# Patient Record
Sex: Female | Born: 1965 | Race: Black or African American | Hispanic: No | Marital: Married | Smoking: Never smoker
Health system: Southern US, Community
[De-identification: ages and names within clinical notes are randomized; demographics above are authoritative.]

## PROBLEM LIST (undated history)

## (undated) DIAGNOSIS — F419 Anxiety disorder, unspecified: Secondary | ICD-10-CM

## (undated) DIAGNOSIS — G4730 Sleep apnea, unspecified: Secondary | ICD-10-CM

## (undated) DIAGNOSIS — I10 Essential (primary) hypertension: Secondary | ICD-10-CM

## (undated) DIAGNOSIS — K219 Gastro-esophageal reflux disease without esophagitis: Secondary | ICD-10-CM

## (undated) HISTORY — DX: Essential (primary) hypertension: I10

## (undated) HISTORY — DX: Gastro-esophageal reflux disease without esophagitis: K21.9

---

## 2004-10-20 ENCOUNTER — Ambulatory Visit (HOSPITAL_COMMUNITY): Admission: RE | Admit: 2004-10-20 | Discharge: 2004-10-20 | Payer: Self-pay | Admitting: Gynecology

## 2009-08-08 ENCOUNTER — Encounter: Payer: Self-pay | Admitting: Obstetrics and Gynecology

## 2009-09-27 ENCOUNTER — Encounter: Payer: Self-pay | Admitting: Pediatric Cardiology

## 2009-10-11 ENCOUNTER — Encounter: Payer: Self-pay | Admitting: Pediatric Cardiology

## 2012-10-27 ENCOUNTER — Other Ambulatory Visit (HOSPITAL_COMMUNITY)
Admission: RE | Admit: 2012-10-27 | Discharge: 2012-10-27 | Disposition: A | Discharge status: Home / Self Care | Payer: 59 | Source: Ambulatory Visit | Attending: Obstetrics and Gynecology | Admitting: Obstetrics and Gynecology

## 2012-10-27 ENCOUNTER — Other Ambulatory Visit: Payer: Self-pay | Admitting: Obstetrics and Gynecology

## 2012-10-27 DIAGNOSIS — Z01419 Encounter for gynecological examination (general) (routine) without abnormal findings: Secondary | ICD-10-CM | POA: Insufficient documentation

## 2012-10-27 DIAGNOSIS — Z1151 Encounter for screening for human papillomavirus (HPV): Secondary | ICD-10-CM | POA: Insufficient documentation

## 2012-12-03 ENCOUNTER — Ambulatory Visit (INDEPENDENT_AMBULATORY_CARE_PROVIDER_SITE_OTHER): Payer: 59 | Admitting: Surgery

## 2012-12-03 ENCOUNTER — Encounter (INDEPENDENT_AMBULATORY_CARE_PROVIDER_SITE_OTHER): Payer: Self-pay | Admitting: Surgery

## 2012-12-03 VITALS — BP 146/88 | HR 82 | Temp 97.6°F | Resp 16 | Ht 59.0 in | Wt 192.0 lb

## 2012-12-03 DIAGNOSIS — E669 Obesity, unspecified: Secondary | ICD-10-CM | POA: Insufficient documentation

## 2012-12-03 DIAGNOSIS — K436 Other and unspecified ventral hernia with obstruction, without gangrene: Secondary | ICD-10-CM | POA: Insufficient documentation

## 2012-12-03 DIAGNOSIS — K429 Umbilical hernia without obstruction or gangrene: Secondary | ICD-10-CM | POA: Insufficient documentation

## 2012-12-03 NOTE — Patient Instructions (Signed)
See the Handout(s) we gave you.  Consider surgery.  Please call our office at [phone] if you wish to schedule surgery or if you have further questions / concerns.   Hernia A hernia occurs when an internal organ pushes out through a weak spot in the abdominal wall. Hernias most commonly occur in the groin and around the navel. Hernias often can be pushed back into place (reduced). Most hernias tend to get worse over time. Some abdominal hernias can get stuck in the opening (irreducible or incarcerated hernia) and cannot be reduced. An irreducible abdominal hernia which is tightly squeezed into the opening is at risk for impaired blood supply (strangulated hernia). A strangulated hernia is a medical emergency. Because of the risk for an irreducible or strangulated hernia, surgery may be recommended to repair a hernia. CAUSES   Heavy lifting.  Prolonged coughing.  Straining to have a bowel movement.  A cut (incision) made during an abdominal surgery. HOME CARE INSTRUCTIONS   Bed rest is not required. You may continue your normal activities.  Avoid lifting more than 10 pounds (4.5 kg) or straining.  Cough gently. If you are a smoker it is best to stop. Even the best hernia repair can break down with the continual strain of coughing. Even if you do not have your hernia repaired, a cough will continue to aggravate the problem.  Do not wear anything tight over your hernia. Do not try to keep it in with an outside bandage or truss. These can damage abdominal contents if they are trapped within the hernia sac.  Eat a normal diet.  Avoid constipation. Straining over long periods of time will increase hernia size and encourage breakdown of repairs. If you cannot do this with diet alone, stool softeners may be used. SEEK IMMEDIATE MEDICAL CARE IF:   You have a fever.  You develop increasing abdominal pain.  You feel nauseous or vomit.  Your hernia is stuck outside the abdomen, looks  discolored, feels hard, or is tender.  You have any changes in your bowel habits or in the hernia that are unusual for you.  You have increased pain or swelling around the hernia.  You cannot push the hernia back in place by applying gentle pressure while lying down. MAKE SURE YOU:   Understand these instructions.  Will watch your condition.  Will get help right away if you are not doing well or get worse. Document Released: 07/16/2005 Document Revised: 10/08/2011 Document Reviewed: 03/04/2008 [employer] Patient Information 2013 [employer].  HERNIA REPAIR: POST OP INSTRUCTIONS  1. DIET: Follow a light bland diet the first 24 hours after arrival home, such as soup, liquids, crackers, etc.  Be sure to include lots of fluids daily.  Avoid fast food or heavy meals as your are more likely to get nauseated.  Eat a low fat the next few days after surgery. 2. Take your usually prescribed home medications unless otherwise directed. 3. PAIN CONTROL: a. Pain is best controlled by a usual combination of three different methods TOGETHER: i. Ice/Heat ii. Over the counter pain medication iii. Prescription pain medication b. Most patients will experience some swelling and bruising around the hernia(s) such as the bellybutton, groins, or old incisions.  Ice packs or heating pads (30-60 minutes up to 6 times a day) will help. Use ice for the first few days to help decrease swelling and bruising, then switch to heat to help relax tight/sore spots and speed recovery.  Some people prefer to   use ice alone, heat alone, alternating between ice & heat.  Experiment to what works for you.  Swelling and bruising can take several weeks to resolve.   c. It is helpful to take an over-the-counter pain medication regularly for the first few weeks.  Choose one of the following that works best for you: i. Naproxen (Aleve, etc)  Two 220mg tabs twice a day ii. Ibuprofen (Advil, etc) Three 200mg tabs four times a day  (every meal & bedtime) iii. Acetaminophen (Tylenol, etc) 325-650mg four times a day (every meal & bedtime) d. A  prescription for pain medication should be given to you upon discharge.  Take your pain medication as prescribed.  i. If you are having problems/concerns with the prescription medicine (does not control pain, nausea, vomiting, rash, itching, etc), please call us [phone] to see if we need to switch you to a different pain medicine that will work better for you and/or control your side effect better. ii. If you need a refill on your pain medication, please contact your pharmacy.  They will contact our office to request authorization. Prescriptions will not be filled after 5 pm or on week-ends. 4. Avoid getting constipated.  Between the surgery and the pain medications, it is common to experience some constipation.  Increasing fluid intake and taking a fiber supplement (such as Metamucil, Citrucel, FiberCon, MiraLax, etc) 1-2 times a day regularly will usually help prevent this problem from occurring.  A mild laxative (prune juice, Milk of Magnesia, MiraLax, etc) should be taken according to package directions if there are no bowel movements after 48 hours.   5. Wash / shower every day.  You may shower over the dressings as they are waterproof.   6. Remove your waterproof bandages 5 days after surgery.  You may leave the incision open to air.  You may replace a dressing/Band-Aid to cover the incision for comfort if you wish.  Continue to shower over incision(s) after the dressing is off.    7. ACTIVITIES as tolerated:   a. You may resume regular (light) daily activities beginning the next day-such as daily self-care, walking, climbing stairs-gradually increasing activities as tolerated.  If you can walk 30 minutes without difficulty, it is safe to try more intense activity such as jogging, treadmill, bicycling, low-impact aerobics, swimming, etc. b. Save the most intensive and strenuous  activity for last such as sit-ups, heavy lifting, contact sports, etc  Refrain from any heavy lifting or straining until you are off narcotics for pain control.   c. DO NOT PUSH THROUGH PAIN.  Let pain be your guide: If it hurts to do something, don't do it.  Pain is your body warning you to avoid that activity for another week until the pain goes down. d. You may drive when you are no longer taking prescription pain medication, you can comfortably wear a seatbelt, and you can safely maneuver your car and apply brakes. e. You may have sexual intercourse when it is comfortable.  8. FOLLOW UP in our office a. Please call [facility] at [phone] to set up an appointment to see your surgeon in the office for a follow-up appointment approximately 2-3 weeks after your surgery. b. Make sure that you call for this appointment the day you arrive home to insure a convenient appointment time. 9.  IF YOU HAVE DISABILITY OR FAMILY LEAVE FORMS, BRING THEM TO THE OFFICE FOR PROCESSING.  DO NOT GIVE THEM TO YOUR DOCTOR.  WHEN TO CALL US (  [phone]: 1. Poor pain control 2. Reactions / problems with new medications (rash/itching, nausea, etc)  3. Fever over 101.5 F (38.5 C) 4. Inability to urinate 5. Nausea and/or vomiting 6. Worsening swelling or bruising 7. Continued bleeding from incision. 8. Increased pain, redness, or drainage from the incision   The clinic staff is available to answer your questions during regular business hours (8:30am-5pm).  Please don't hesitate to call and ask to speak to one of our nurses for clinical concerns.   If you have a medical emergency, go to the nearest emergency room or call 911.  A surgeon from [facility] is always on call at the hospitals in [place]  [facility] [address], [address], [place], [place]  [address] ?  [address] [address], [place], [place]   [address] MAIN: [phone] ? TOLL FREE: [phone] ? FAX: [phone] [email or web address]  

## 2012-12-03 NOTE — Progress Notes (Signed)
Subjective:     Patient ID: Lauren Avila, female   DOB: 1966/01/29, 47 y.o.   MRN: [id number]  HPI  Lauren Avila  [birth year] [id number]  Patient Care Team: Milus Height, PA-C as PCP - General (Nurse Practitioner) Geryl Rankins, MD as Consulting Physician (Obstetrics and Gynecology)  This patient is a 47 y.o.female who presents today for surgical evaluation at the request of Ms Redmon.   Reason for visit: Periumbilical pain and mass.  ?hernia  Pleasant obese female.  She comes today with her husband.  She noted a lump around her bellybutton when she was pregnant in 2011.  It never really went away.  Her gynecologist did an ultrasound since the patient was concerned about fibroids.  Patient had few mild fibroids.  Umbilical hernia suspected.  Felt like it has gotten larger and more symptomatic.  She notes a pulling type of pain.  She feels like it swirls.  Worse when she is stretching or reaching.  Because it became more sensitive, she saw her primary care team.  [name] was concerned about a worsening hernia and sent the patient to me for evaluation.  Patient normally has a bowel movement every day.  No nausea or vomiting.  No emergency room visits.  Can walk at least 20 minutes without difficulty.  No exertional pain and chest/arm/jaw.  No prior cardiac history.  Never smoked.  Had C-sections x2.  No complications related to that.  Patient Active Problem List   Diagnosis Date Noted  . Obesity (BMI 30-39.9) 12/03/2012  . Incarcerated epigastric hernia 12/03/2012  . Umbilical hernia 12/03/2012    Past Medical History  Diagnosis Date  . GERD (gastroesophageal reflux disease)   . Hypertension     Past Surgical History  Procedure Laterality Date  . Cesarean section  1991, 2011    2x    History   Social History  . Marital Status: Married    Spouse Name: N/A    Number of Children: N/A  . Years of Education: N/A   Occupational History  . Not on file.   Social History  Main Topics  . Smoking status: Never Smoker   . Smokeless tobacco: Not on file  . Alcohol Use: Yes     Comment: very rare  . Drug Use: No  . Sexually Active: Not on file   Other Topics Concern  . Not on file   Social History Narrative  . No narrative on file    Family History  Problem Relation Age of Onset  . Diabetes Mother   . Hypertension Mother     Current Outpatient Prescriptions  Medication Sig Dispense Refill  . labetalol (NORMODYNE) 100 MG tablet Take 100 mg by mouth 2 (two) times daily.      Marland Kitchen nystatin-triamcinolone ointment (MYCOLOG) Apply topically 2 (two) times daily.      . ranitidine (ZANTAC) 150 MG tablet Take 150 mg by mouth 2 (two) times daily.       No current facility-administered medications for this visit.     No Known Allergies  BP 146/88  Pulse 82  Temp(Src) 97.6 F (36.4 C) (Temporal)  Resp 16  Ht 4\' 11"  (1.499 m)  Wt 192 lb (87.091 kg)  BMI 38.76 kg/m2  SpO2 97%  No results found.   Review of Systems  Constitutional: Negative for fever, chills, diaphoresis, appetite change and fatigue.  HENT: Negative for ear pain, sore throat, trouble swallowing, neck pain and ear discharge.   Eyes: Negative for  photophobia, discharge and visual disturbance.  Respiratory: Negative for cough, choking, chest tightness and shortness of breath.   Cardiovascular: Negative for chest pain and palpitations.  Gastrointestinal: Positive for abdominal pain. Negative for nausea, vomiting, diarrhea, constipation, anal bleeding and rectal pain.  Genitourinary: Negative for dysuria, frequency and difficulty urinating.  Musculoskeletal: Negative for myalgias and gait problem.  Skin: Negative for color change, pallor and rash.  Neurological: Negative for dizziness, speech difficulty, weakness and numbness.  Hematological: Negative for adenopathy.  Psychiatric/Behavioral: Negative for confusion and agitation. The patient is not nervous/anxious.        Objective:    Physical Exam  Constitutional: She is oriented to person, place, and time. She appears well-developed and well-nourished. No distress.  HENT:  Head: Normocephalic.  Mouth/Throat: Oropharynx is clear and moist. No oropharyngeal exudate.  Eyes: Conjunctivae and EOM are normal. Pupils are equal, round, and reactive to light. No scleral icterus.  Neck: Normal range of motion. Neck supple. No tracheal deviation present.  Cardiovascular: Normal rate, regular rhythm and intact distal pulses.   Pulmonary/Chest: Effort normal and breath sounds normal. No respiratory distress. She exhibits no tenderness.  Abdominal: Soft. She exhibits no distension and no mass. There is no tenderness. A hernia is present. Hernia confirmed negative in the right inguinal area and confirmed negative in the left inguinal area.    Genitourinary: No vaginal discharge found.  Musculoskeletal: Normal range of motion. She exhibits no tenderness.  Lymphadenopathy:    She has no cervical adenopathy.       Right: No inguinal adenopathy present.       Left: No inguinal adenopathy present.  Neurological: She is alert and oriented to person, place, and time. No cranial nerve deficit. She exhibits normal muscle tone. Coordination normal.  Skin: Skin is warm and dry. No rash noted. She is not diaphoretic. No erythema.  Psychiatric: She has a normal mood and affect. Her behavior is normal. Judgment and thought content normal.       Assessment:     Incarcerated epigastric and small reducible umbilical ventral hernias     Plan:     I think she would benefit from laparoscopic exploration and repair.  Given her obesity, underlay repair with mesh a good idea.  Hopefully just a 15 cm sheet.  Hopefully go home that day as an outpatient.  The anatomy & physiology of the abdominal wall was discussed.  The pathophysiology of hernias was discussed.  Natural history risks without surgery including progeressive enlargement, pain,  incarceration & strangulation was discussed.   Contributors to complications such as smoking, obesity, diabetes, prior surgery, etc were discussed.   I feel the risks of no intervention will lead to serious problems that outweigh the operative risks; therefore, I recommended surgery to reduce and repair the hernia.  I explained laparoscopic techniques with possible need for an open approach.  I noted the probable use of mesh to patch and/or buttress the hernia repair  Risks such as bleeding, infection, abscess, need for further treatment, heart attack, death, and other risks were discussed.  I noted a good likelihood this will help address the problem.   Goals of post-operative recovery were discussed as well.  Possibility that this will not correct all symptoms was explained.  I stressed the importance of low-impact activity, aggressive pain control, avoiding constipation, & not pushing through pain to minimize risk of post-operative chronic pain or injury. Possibility of reherniation especially with smoking, obesity, diabetes, immunosuppression, and other health conditions  was discussed.  We will work to minimize complications.     An educational handout further explaining the pathology & treatment options was given as well.  Questions were answered.  The patient expresses understanding & wishes to proceed with surgery.

## 2012-12-11 ENCOUNTER — Encounter (INDEPENDENT_AMBULATORY_CARE_PROVIDER_SITE_OTHER): Payer: Self-pay

## 2013-02-16 ENCOUNTER — Encounter (HOSPITAL_COMMUNITY): Payer: Self-pay | Admitting: Pharmacy Technician

## 2013-02-16 NOTE — Patient Instructions (Signed)
Jaylee Freeze  02/16/2013   Your procedure is scheduled on:  02/24/13    Report to [facility] at   0530      AM.  Call this number if you have problems the morning of surgery: [phone]   Remember:   Do not eat food or drink liquids after midnight.   Take these medicines the morning of surgery with A SIP OF WATER:    Do not wear jewelry, make-up or nail polish.  Do not wear lotions, powders, or perfumes.   Do not shave 48 hours prior to surgery.   Do not bring valuables to the hospital.  Contacts, dentures or bridgework may not be worn into surgery.     Patients discharged the day of surgery will not be allowed to drive  home.  Name and phone number of your driver:    SEE [facility] INSTRUCTION SHEET    Please read over the following fact sheets that you were given: coughing and deep breathing exercises, leg exercises               Failure to comply with these instructions may result in cancellation of your surgery.                Patient Signature ____________________________              Nurse Signature _____________________________

## 2013-02-17 ENCOUNTER — Encounter (HOSPITAL_COMMUNITY): Payer: Self-pay

## 2013-02-17 ENCOUNTER — Ambulatory Visit (HOSPITAL_COMMUNITY)
Admission: RE | Admit: 2013-02-17 | Discharge: 2013-02-17 | Disposition: A | Discharge status: Home / Self Care | Payer: 59 | Source: Ambulatory Visit | Attending: Surgery | Admitting: Surgery

## 2013-02-17 ENCOUNTER — Encounter (HOSPITAL_COMMUNITY)
Admission: RE | Admit: 2013-02-17 | Discharge: 2013-02-17 | Disposition: A | Discharge status: Home / Self Care | Payer: 59 | Source: Ambulatory Visit | Attending: Surgery | Admitting: Surgery

## 2013-02-17 DIAGNOSIS — K429 Umbilical hernia without obstruction or gangrene: Secondary | ICD-10-CM | POA: Insufficient documentation

## 2013-02-17 DIAGNOSIS — Z01812 Encounter for preprocedural laboratory examination: Secondary | ICD-10-CM | POA: Insufficient documentation

## 2013-02-17 DIAGNOSIS — K439 Ventral hernia without obstruction or gangrene: Secondary | ICD-10-CM | POA: Insufficient documentation

## 2013-02-17 DIAGNOSIS — Z01810 Encounter for preprocedural cardiovascular examination: Secondary | ICD-10-CM | POA: Insufficient documentation

## 2013-02-17 DIAGNOSIS — I10 Essential (primary) hypertension: Secondary | ICD-10-CM | POA: Insufficient documentation

## 2013-02-17 HISTORY — DX: Anxiety disorder, unspecified: F41.9

## 2013-02-17 HISTORY — DX: Sleep apnea, unspecified: G47.30

## 2013-02-17 LAB — BASIC METABOLIC PANEL
Chloride: 102 mEq/L (ref 96–112)
Creatinine, Ser: 0.76 mg/dL (ref 0.50–1.10)
GFR calc Af Amer: >90 mL/min (ref >90)
Sodium: 138 mEq/L (ref 135–145)

## 2013-02-17 LAB — HCG, SERUM, QUALITATIVE: Preg, Serum: NEGATIVE

## 2013-02-17 LAB — CBC
MCV: 82.4 fL (ref 78.0–100.0)
Platelets: 372 10*3/uL (ref 150–400)
RBC: 4.94 MIL/uL (ref 3.87–5.11)
RDW: 13.3 % (ref 11.5–15.5)
WBC: 4.9 10*3/uL (ref 4.0–10.5)

## 2013-02-17 NOTE — Progress Notes (Signed)
Last office visit note with PCP dated 11/24/12 on chart

## 2013-02-17 NOTE — Progress Notes (Signed)
Sleep Study Reports requested and placed on chart.   Sleep Study Report from 01/26/2008 on chart with CPAP settings highlighted.   Sleep Study Report fron 02/07/2007 on chart

## 2013-02-23 NOTE — Anesthesia Preprocedure Evaluation (Addendum)
Anesthesia Evaluation  Patient identified by MRN, date of birth, ID band Patient awake    Reviewed: Allergy & Precautions, H&P , NPO status , Patient's Chart, lab work & pertinent test results  Airway Mallampati: II TM Distance: >3 FB Neck ROM: Full    Dental  (+) Teeth Intact and Dental Advisory Given   Pulmonary neg pulmonary ROS, sleep apnea and Continuous Positive Airway Pressure Ventilation ,  breath sounds clear to auscultation  Pulmonary exam normal       Cardiovascular hypertension, Pt. on medications and Pt. on home beta blockers Rhythm:Regular Rate:Normal     Neuro/Psych Anxiety negative neurological ROS     GI/Hepatic Neg liver ROS, GERD-  Medicated,  Endo/Other  negative endocrine ROS  Renal/GU negative Renal ROS  negative genitourinary   Musculoskeletal negative musculoskeletal ROS (+)   Abdominal   Peds  Hematology negative hematology ROS (+)   Anesthesia Other Findings   Reproductive/Obstetrics                          Anesthesia Physical Anesthesia Plan  ASA: II  Anesthesia Plan: General   Post-op Pain Management:    Induction: Intravenous  Airway Management Planned: Oral ETT  Additional Equipment:   Intra-op Plan:   Post-operative Plan: Extubation in OR  Informed Consent: I have reviewed the patients History and Physical, chart, labs and discussed the procedure including the risks, benefits and alternatives for the proposed anesthesia with the patient or authorized representative who has indicated his/her understanding and acceptance.   Dental advisory given  Plan Discussed with: CRNA  Anesthesia Plan Comments:         Anesthesia Quick Evaluation

## 2013-02-24 ENCOUNTER — Encounter: Payer: Self-pay | Admitting: General Surgery

## 2013-02-24 ENCOUNTER — Ambulatory Visit (HOSPITAL_COMMUNITY)
Admission: RE | Admit: 2013-02-24 | Discharge: 2013-02-24 | Disposition: A | Discharge status: Home / Self Care | Payer: 59 | Source: Ambulatory Visit | Attending: Surgery | Admitting: Surgery

## 2013-02-24 ENCOUNTER — Encounter (HOSPITAL_COMMUNITY)
Admission: RE | Disposition: A | Discharge status: Home / Self Care | Payer: Self-pay | Source: Ambulatory Visit | Attending: Surgery

## 2013-02-24 ENCOUNTER — Encounter (HOSPITAL_COMMUNITY): Payer: Self-pay | Admitting: Anesthesiology

## 2013-02-24 ENCOUNTER — Ambulatory Visit (HOSPITAL_COMMUNITY): Payer: 59 | Admitting: Anesthesiology

## 2013-02-24 ENCOUNTER — Encounter (HOSPITAL_COMMUNITY): Payer: Self-pay | Admitting: *Deleted

## 2013-02-24 DIAGNOSIS — G4730 Sleep apnea, unspecified: Secondary | ICD-10-CM | POA: Insufficient documentation

## 2013-02-24 DIAGNOSIS — K420 Umbilical hernia with obstruction, without gangrene: Secondary | ICD-10-CM

## 2013-02-24 DIAGNOSIS — K219 Gastro-esophageal reflux disease without esophagitis: Secondary | ICD-10-CM | POA: Insufficient documentation

## 2013-02-24 DIAGNOSIS — K429 Umbilical hernia without obstruction or gangrene: Secondary | ICD-10-CM

## 2013-02-24 DIAGNOSIS — M6200 Separation of muscle (nontraumatic), unspecified site: Secondary | ICD-10-CM | POA: Insufficient documentation

## 2013-02-24 DIAGNOSIS — K436 Other and unspecified ventral hernia with obstruction, without gangrene: Secondary | ICD-10-CM

## 2013-02-24 DIAGNOSIS — E669 Obesity, unspecified: Secondary | ICD-10-CM | POA: Insufficient documentation

## 2013-02-24 DIAGNOSIS — I10 Essential (primary) hypertension: Secondary | ICD-10-CM | POA: Insufficient documentation

## 2013-02-24 DIAGNOSIS — D259 Leiomyoma of uterus, unspecified: Secondary | ICD-10-CM | POA: Insufficient documentation

## 2013-02-24 HISTORY — PX: UMBILICAL HERNIA REPAIR: SHX196

## 2013-02-24 HISTORY — PX: INSERTION OF MESH: SHX5868

## 2013-02-24 SURGERY — REPAIR, HERNIA, UMBILICAL, LAPAROSCOPIC
Anesthesia: General | Site: Abdomen | Wound class: Clean

## 2013-02-24 MED ORDER — SUFENTANIL CITRATE 50 MCG/ML IV SOLN
INTRAVENOUS | Status: DC | PRN
  Administered 2013-02-24: 5 ug via INTRAVENOUS
  Administered 2013-02-24: 10 ug via INTRAVENOUS
  Administered 2013-02-24: 5 ug via INTRAVENOUS
  Administered 2013-02-24 (×2): 10 ug via INTRAVENOUS
  Administered 2013-02-24 (×2): 5 ug via INTRAVENOUS

## 2013-02-24 MED ORDER — SODIUM CHLORIDE 0.9 % IJ SOLN: 3.0000 mL | INTRAMUSCULAR | Status: DC | PRN

## 2013-02-24 MED ORDER — BUPIVACAINE 0.25 % ON-Q PUMP DUAL CATH 300 ML
300.0000 mL | INJECTION | Status: DC
  Filled 2013-02-24: qty 300

## 2013-02-24 MED ORDER — LIDOCAINE HCL (CARDIAC) 20 MG/ML IV SOLN
INTRAVENOUS | Status: DC | PRN
  Administered 2013-02-24: 70 mg via INTRAVENOUS

## 2013-02-24 MED ORDER — ACETAMINOPHEN 650 MG RE SUPP
650.0000 mg | RECTAL | Status: DC | PRN
  Filled 2013-02-24: qty 1

## 2013-02-24 MED ORDER — CEFAZOLIN SODIUM-DEXTROSE 2-3 GM-% IV SOLR
INTRAVENOUS | Status: AC
  Filled 2013-02-24: qty 50

## 2013-02-24 MED ORDER — PROPOFOL 10 MG/ML IV BOLUS
INTRAVENOUS | Status: DC | PRN
  Administered 2013-02-24: 50 mg via INTRAVENOUS
  Administered 2013-02-24: 150 mg via INTRAVENOUS

## 2013-02-24 MED ORDER — HYDRALAZINE HCL 20 MG/ML IJ SOLN
5.0000 mg | Freq: Once | INTRAMUSCULAR | Status: AC
  Administered 2013-02-24: 5 mg via INTRAVENOUS

## 2013-02-24 MED ORDER — HYDROMORPHONE HCL PF 1 MG/ML IJ SOLN
INTRAMUSCULAR | Status: AC
  Filled 2013-02-24: qty 1

## 2013-02-24 MED ORDER — BUPIVACAINE-EPINEPHRINE PF 0.25-1:200000 % IJ SOLN
INTRAMUSCULAR | Status: AC
  Filled 2013-02-24: qty 30

## 2013-02-24 MED ORDER — BUPIVACAINE-EPINEPHRINE PF 0.25-1:200000 % IJ SOLN
INTRAMUSCULAR | Status: DC | PRN
  Administered 2013-02-24: 80 mL

## 2013-02-24 MED ORDER — STERILE WATER FOR IRRIGATION IR SOLN
Status: DC | PRN
  Administered 2013-02-24: 1500 mL

## 2013-02-24 MED ORDER — ONDANSETRON HCL 4 MG/2ML IJ SOLN
INTRAMUSCULAR | Status: DC | PRN
  Administered 2013-02-24: 4 mg via INTRAVENOUS

## 2013-02-24 MED ORDER — CEFAZOLIN SODIUM-DEXTROSE 2-3 GM-% IV SOLR
2.0000 g | INTRAVENOUS | Status: AC
  Administered 2013-02-24: 2 g via INTRAVENOUS

## 2013-02-24 MED ORDER — BUPIVACAINE 0.25 % ON-Q PUMP DUAL CATH 300 ML
INJECTION | Status: DC | PRN
  Administered 2013-02-24: 300 mL

## 2013-02-24 MED ORDER — ACETAMINOPHEN 10 MG/ML IV SOLN
INTRAVENOUS | Status: DC | PRN
  Administered 2013-02-24: 1000 mg via INTRAVENOUS

## 2013-02-24 MED ORDER — ACETAMINOPHEN 10 MG/ML IV SOLN
1000.0000 mg | Freq: Once | INTRAVENOUS | Status: DC
  Filled 2013-02-24: qty 100

## 2013-02-24 MED ORDER — LIDOCAINE HCL 4 % MT SOLN
OROMUCOSAL | Status: DC | PRN
  Administered 2013-02-24: 4 mL via TOPICAL

## 2013-02-24 MED ORDER — HYDROMORPHONE HCL PF 1 MG/ML IJ SOLN
0.2500 mg | INTRAMUSCULAR | Status: DC | PRN
  Administered 2013-02-24 (×2): 0.5 mg via INTRAVENOUS

## 2013-02-24 MED ORDER — SODIUM CHLORIDE 0.9 % IJ SOLN: 3.0000 mL | Freq: Two times a day (BID) | INTRAMUSCULAR | Status: DC

## 2013-02-24 MED ORDER — FENTANYL CITRATE 0.05 MG/ML IJ SOLN
25.0000 ug | INTRAMUSCULAR | Status: DC | PRN
Start: 2013-02-24 — End: 2013-02-24

## 2013-02-24 MED ORDER — OXYCODONE HCL 5 MG PO TABS
5.0000 mg | ORAL_TABLET | ORAL | Status: DC | PRN
  Administered 2013-02-24: 5 mg via ORAL
  Filled 2013-02-24: qty 1

## 2013-02-24 MED ORDER — CHLORHEXIDINE GLUCONATE 4 % EX LIQD
1.0000 "application " | Freq: Once | CUTANEOUS | Status: DC
  Filled 2013-02-24: qty 15

## 2013-02-24 MED ORDER — DEXAMETHASONE SODIUM PHOSPHATE 10 MG/ML IJ SOLN
INTRAMUSCULAR | Status: DC | PRN
  Administered 2013-02-24: 10 mg via INTRAVENOUS

## 2013-02-24 MED ORDER — ONDANSETRON HCL 4 MG/2ML IJ SOLN
4.0000 mg | Freq: Four times a day (QID) | INTRAMUSCULAR | Status: DC | PRN
  Filled 2013-02-24: qty 2

## 2013-02-24 MED ORDER — HYDRALAZINE HCL 20 MG/ML IJ SOLN
INTRAMUSCULAR | Status: AC
  Filled 2013-02-24: qty 1

## 2013-02-24 MED ORDER — ROCURONIUM BROMIDE 100 MG/10ML IV SOLN
INTRAVENOUS | Status: DC | PRN
  Administered 2013-02-24: 5 mg via INTRAVENOUS
  Administered 2013-02-24: 10 mg via INTRAVENOUS
  Administered 2013-02-24: 35 mg via INTRAVENOUS
  Administered 2013-02-24: 10 mg via INTRAVENOUS

## 2013-02-24 MED ORDER — GLYCOPYRROLATE 0.2 MG/ML IJ SOLN
INTRAMUSCULAR | Status: DC | PRN
  Administered 2013-02-24: 0.6 mg via INTRAVENOUS

## 2013-02-24 MED ORDER — BUPIVACAINE-EPINEPHRINE 0.25% -1:200000 IJ SOLN
INTRAMUSCULAR | Status: AC
  Filled 2013-02-24: qty 1

## 2013-02-24 MED ORDER — LACTATED RINGERS IV SOLN
INTRAVENOUS | Status: DC
  Administered 2013-02-24: 12:00:00 via INTRAVENOUS

## 2013-02-24 MED ORDER — LACTATED RINGERS IV SOLN
INTRAVENOUS | Status: DC | PRN
  Administered 2013-02-24 (×2): via INTRAVENOUS

## 2013-02-24 MED ORDER — OXYCODONE HCL 5 MG PO TABS: 5.0000 mg | ORAL_TABLET | ORAL | Status: DC | PRN

## 2013-02-24 MED ORDER — LACTATED RINGERS IR SOLN
Status: DC | PRN
  Administered 2013-02-24: 1000 mL

## 2013-02-24 MED ORDER — HYDRALAZINE HCL 20 MG/ML IJ SOLN
INTRAMUSCULAR | Status: DC | PRN
  Administered 2013-02-24: 5 mg via INTRAVENOUS
  Administered 2013-02-24: 10 mg via INTRAVENOUS
  Administered 2013-02-24: 5 mg via INTRAVENOUS

## 2013-02-24 MED ORDER — NAPROXEN 500 MG PO TABS: 500.0000 mg | ORAL_TABLET | Freq: Two times a day (BID) | ORAL | Status: DC

## 2013-02-24 MED ORDER — LABETALOL HCL 5 MG/ML IV SOLN
INTRAVENOUS | Status: DC | PRN
  Administered 2013-02-24 (×2): 5 mg via INTRAVENOUS

## 2013-02-24 MED ORDER — PROMETHAZINE HCL 25 MG/ML IJ SOLN: 6.2500 mg | INTRAMUSCULAR | Status: DC | PRN

## 2013-02-24 MED ORDER — SODIUM CHLORIDE 0.9 % IV SOLN: 250.0000 mL | INTRAVENOUS | Status: DC | PRN

## 2013-02-24 MED ORDER — SUCCINYLCHOLINE CHLORIDE 20 MG/ML IJ SOLN
INTRAMUSCULAR | Status: DC | PRN
  Administered 2013-02-24: 100 mg via INTRAVENOUS

## 2013-02-24 MED ORDER — MIDAZOLAM HCL 5 MG/5ML IJ SOLN
INTRAMUSCULAR | Status: DC | PRN
  Administered 2013-02-24: 2 mg via INTRAVENOUS

## 2013-02-24 MED ORDER — NEOSTIGMINE METHYLSULFATE 1 MG/ML IJ SOLN
INTRAMUSCULAR | Status: DC | PRN
  Administered 2013-02-24: 4 mg via INTRAVENOUS

## 2013-02-24 MED ORDER — KETOROLAC TROMETHAMINE 30 MG/ML IJ SOLN
INTRAMUSCULAR | Status: DC | PRN
  Administered 2013-02-24: 30 mg via INTRAVENOUS

## 2013-02-24 MED ORDER — ACETAMINOPHEN 325 MG PO TABS: 650.0000 mg | ORAL_TABLET | ORAL | Status: DC | PRN

## 2013-02-24 SURGICAL SUPPLY — 61 items
APL SKNCLS STERI-STRIP NONHPOA (GAUZE/BANDAGES/DRESSINGS)
APPLIER CLIP 5 13 M/L LIGAMAX5 (MISCELLANEOUS)
APR CLP MED LRG 5 ANG JAW (MISCELLANEOUS)
BAG URINE DRAINAGE (UROLOGICAL SUPPLIES) IMPLANT
BENZOIN TINCTURE PRP APPL 2/3 (GAUZE/BANDAGES/DRESSINGS) IMPLANT
BINDER ABD UNIV 12 45-62 (WOUND CARE) ×1 IMPLANT
BINDER ABDOMINAL 46IN 62IN (WOUND CARE) ×2
CANISTER SUCTION 2500CC (MISCELLANEOUS) ×2 IMPLANT
CATH FOLEY 3WAY  5CC 16FR (CATHETERS)
CATH FOLEY 3WAY 5CC 16FR (CATHETERS) IMPLANT
CATH KIT ON-Q SILVERSOAK 7.5IN (CATHETERS) ×4 IMPLANT
CLIP APPLIE 5 13 M/L LIGAMAX5 (MISCELLANEOUS) IMPLANT
CLOTH BEACON ORANGE TIMEOUT ST (SAFETY) ×2 IMPLANT
DECANTER SPIKE VIAL GLASS SM (MISCELLANEOUS) ×2 IMPLANT
DEVICE SECURE STRAP 25 ABSORB (INSTRUMENTS) ×2 IMPLANT
DEVICE TROCAR PUNCTURE CLOSURE (ENDOMECHANICALS) ×2 IMPLANT
DISSECTOR BLUNT TIP ENDO 5MM (MISCELLANEOUS) IMPLANT
DRAPE LAPAROSCOPIC ABDOMINAL (DRAPES) ×2 IMPLANT
DRSG TEGADERM 2-3/8X2-3/4 SM (GAUZE/BANDAGES/DRESSINGS) ×6 IMPLANT
DRSG TEGADERM 4X4.75 (GAUZE/BANDAGES/DRESSINGS) ×2 IMPLANT
ELECT REM PT RETURN 9FT ADLT (ELECTROSURGICAL) ×2
ELECTRODE REM PT RTRN 9FT ADLT (ELECTROSURGICAL) ×1 IMPLANT
FILTER SMOKE EVAC LAPAROSHD (FILTER) IMPLANT
GAUZE SPONGE 2X2 8PLY STRL LF (GAUZE/BANDAGES/DRESSINGS) ×1 IMPLANT
GLOVE BIOGEL PI IND STRL 7.0 (GLOVE) ×4 IMPLANT
GLOVE BIOGEL PI INDICATOR 7.0 (GLOVE) ×4
GLOVE ECLIPSE 8.0 STRL XLNG CF (GLOVE) ×2 IMPLANT
GLOVE INDICATOR 8.0 STRL GRN (GLOVE) ×2 IMPLANT
GOWN STRL NON-REIN LRG LVL3 (GOWN DISPOSABLE) IMPLANT
GOWN STRL REIN XL XLG (GOWN DISPOSABLE) ×8 IMPLANT
KIT BASIN OR (CUSTOM PROCEDURE TRAY) ×2 IMPLANT
MARKER SKIN DUAL TIP RULER LAB (MISCELLANEOUS) ×2 IMPLANT
MESH VENTRALIGHT ST 7X9N (Mesh General) ×2 IMPLANT
NEEDLE SPNL 22GX3.5 QUINCKE BK (NEEDLE) ×2 IMPLANT
NS IRRIG 1000ML POUR BTL (IV SOLUTION) ×2 IMPLANT
PENCIL BUTTON HOLSTER BLD 10FT (ELECTRODE) IMPLANT
PLUG CATH AND CAP STER (CATHETERS) IMPLANT
SCALPEL HARMONIC ACE (MISCELLANEOUS) IMPLANT
SCISSORS LAP 5X35 DISP (ENDOMECHANICALS) ×2 IMPLANT
SET CYSTO W/LG BORE CLAMP LF (SET/KITS/TRAYS/PACK) IMPLANT
SET IRRIG TUBING LAPAROSCOPIC (IRRIGATION / IRRIGATOR) IMPLANT
SLEEVE Z-THREAD 5X100MM (TROCAR) IMPLANT
SOLUTION ANTI FOG 6CC (MISCELLANEOUS) IMPLANT
SPONGE GAUZE 2X2 STER 10/PKG (GAUZE/BANDAGES/DRESSINGS) ×1
STAPLER VISISTAT 35W (STAPLE) ×2 IMPLANT
STRIP CLOSURE SKIN 1/2X4 (GAUZE/BANDAGES/DRESSINGS) ×6 IMPLANT
SUT ETHIBOND NAB CT1 #1 30IN (SUTURE) IMPLANT
SUT MNCRL AB 4-0 PS2 18 (SUTURE) ×2 IMPLANT
SUT NOVA NAB DX-16 0-1 5-0 T12 (SUTURE) IMPLANT
SUT PDS AB 0 CT1 36 (SUTURE) ×2 IMPLANT
SUT PROLENE 1 CT 1 30 (SUTURE) ×10 IMPLANT
SUT VIC AB 2-0 UR6 27 (SUTURE) IMPLANT
SYRINGE 10CC LL (SYRINGE) IMPLANT
TACKER 5MM HERNIA 3.5CML NAB (ENDOMECHANICALS) IMPLANT
TOWEL OR 17X26 10 PK STRL BLUE (TOWEL DISPOSABLE) ×2 IMPLANT
TRAY FOLEY CATH 14FRSI W/METER (CATHETERS) ×2 IMPLANT
TRAY LAP CHOLE (CUSTOM PROCEDURE TRAY) ×2 IMPLANT
TROCAR BLADELESS OPT 5 100 (ENDOMECHANICALS) ×2 IMPLANT
TROCAR XCEL NON-BLD 11X100MML (ENDOMECHANICALS) ×2 IMPLANT
TUBING INSUFFLATION 10FT LAP (TUBING) ×2 IMPLANT
TUNNELER SHEATH ON-Q 16GX12 DP (PAIN MANAGEMENT) ×2 IMPLANT

## 2013-02-24 NOTE — Progress Notes (Signed)
Text sent to dr gross re: pt request work excuse and also up to BR  Vomited about1050 (per tech)  Now states she feels much better and wants to go home  Taking ice  Awaiting return call from MD

## 2013-02-24 NOTE — Progress Notes (Signed)
Apresoline 5 mg IVP repeated for elevated blood pressures.

## 2013-02-24 NOTE — Op Note (Signed)
02/24/2013  9:49 AM  PATIENT:  Lauren Avila  47 y.o. female  Patient Care Team: Milus Height, PA-C as PCP - General (Nurse Practitioner) Geryl Rankins, MD as Consulting Physician (Obstetrics and Gynecology)  PRE-OPERATIVE DIAGNOSIS:  epigastric and umbilical abdominal wall hernias  POST-OPERATIVE DIAGNOSIS:  Incarcerated supraumbilical hernia and umbilical hernia  PROCEDURE:  Procedure(s): LAPAROSCOPIC REPAIR OF SUPRA UMBILICAL AND UMBILICAL  HERNIAS PLACEMENT OF OnQ PUMP INSERTION OF MESH  SURGEON:  Surgeon(s): Ardeth Sportsman, MD  ASSISTANT: RN   ANESTHESIA:   local and general  EBL:  Total I/O In: 1900 [I.V.:1900] Out: 800 [Urine:800]  Delay start of Pharmacological VTE agent (>24hrs) due to surgical blood loss or risk of bleeding:  no  DRAINS: none   SPECIMEN:  No Specimen  DISPOSITION OF SPECIMEN:  N/A  COUNTS:  YES  PLAN OF CARE: Discharge to home after PACU  PATIENT DISPOSITION:  PACU - hemodynamically stable.  INDICATION: Pleasant patient has developed a ventral wall abdominal hernia.   Recommendation was made for surgical repair:  The anatomy & physiology of the abdominal wall was discussed. The pathophysiology of hernias was discussed. Natural history risks without surgery including progeressive enlargement, pain, incarceration & strangulation was discussed. Contributors to complications such as smoking, obesity, diabetes, prior surgery, etc were discussed.  I feel the risks of no intervention will lead to serious problems that outweigh the operative risks; therefore, I recommended surgery to reduce and repair the hernia. I explained laparoscopic techniques with possible need for an open approach. I noted the probable use of mesh to patch and/or buttress the hernia repair  Risks such as bleeding, infection, abscess, need for further treatment, heart attack, death, and other risks were discussed. I noted a good likelihood this will help address the problem.  Goals of post-operative recovery were discussed as well. Possibility that this will not correct all symptoms was explained. I stressed the importance of low-impact activity, aggressive pain control, avoiding constipation, & not pushing through pain to minimize risk of post-operative chronic pain or injury. Possibility of reherniation especially with smoking, obesity, diabetes, immunosuppression, and other health conditions was discussed. We will work to minimize complications.  An educational handout further explaining the pathology & treatment options was given as well. Questions were answered. The patient expresses understanding & wishes to proceed with surgery.   OR FINDINGS: Patient had central diastases recti with umbilical hernia.  She had falciform ligament incarcerated in a supraumbilical hernia.  Region 7 x 3 cm   Type of repair - Laparoscopic underlay repair   Name of mesh - Bard Ventralex  Size of mesh - Length 22 cm, Width 17 cm  Mesh overlap - 5-7 cm  Placement of mesh -  Intraperitoneal underlay repair   DESCRIPTION:   Informed consent was confirmed. The patient underwent general anaesthesia without difficulty. The patient was positioned appropriately. VTE prevention in place. The patient's abdomen was clipped, prepped, & draped in a sterile fashion. Surgical timeout confirmed our plan.  The patient was positioned in reverse Trendelenburg. Abdominal entry was gained using optical entry technique in the left upper abdomen. Entry was clean. I induced carbon dioxide insufflation. Camera inspection revealed no injury. Extra ports were carefully placed under direct laparoscopic visualization.   I could see the hernia in the Central abdomen.  I freed off the falciform ligament off the anterior abdominal wall to expose supraumbilical hernia 2x3 cm incarcerated in the setting of diastases recti.  She had adhesions from her prior C-section  in the suprapubic region of the uterus the  peritoneum.  I left those alone.  She had a 3 x 2 cm umbilical hernia not incarcerated.I primarily used and focused cold scissors.    I made sure hemostasis was good.  I mapped out the region using a needle passer.   To ensure that I would have at least 5 cm radial coverage outside of the hernia defect & given her significant central obesity, I chose a 22x17cm cm dual sided mesh.  I placed #1 Prolene stitches around its edge about every 5 cm = 14 total.  I rolled the mesh & placed into the peritoneal cavity through the 10 cm fascial defect.  I unrolled  the mesh and positioned it appropriately.  I secured the mesh to cover up the hernia defect using a laparoscopic suture passer to pass the tails of the Prolene through the abdominal wall & tagged them with clamps.  I started out in four corners to make sure I had the mesh centered over the hernia defect appropriately, and then proceeded to work in quadrants.  We evacuated CO2 & desufflated the abdomen.  I tied the fascial stitches down.  I reinsufflated the abdomen.  The mesh provided at least 5-10 cm circumferential coverage around the entire region of hernia defects.   I tacked the edges & central part of the mesh with SecureStrap absorbable tacks.   Hemostasis was excellent.  I closed the fascia port site on the 10 mm port using a 0 Vicryl stitch using laparoscopic intracorporeal suture passer.  I then placed On-Q catheter sheaths in the preperitoneal plane under direct laparoscopic guidance from the subxiphoid region down to the posterior iliac crests in the lower flanks. I did reinspection. Hemostasis was good. Mesh laid well. Capnoperitoneum was evacuated. Ports were removed. The skin was closed with Monocryl at the port sites and Steri-Strips on the fascial stitch puncture sites. OnQ catheters were placed and the sheathes peeled away. On-Q pump was secured. Patient is being extubated to go to the recovery room. I'm about to discuss operative findings with  the patient's family.

## 2013-02-24 NOTE — Progress Notes (Signed)
Spoke to [employer]  PA for [employer] to [employer] pt  She will send work excuse to our unit now

## 2013-02-24 NOTE — Transfer of Care (Signed)
Immediate Anesthesia Transfer of Care Note  Patient: [name]  Procedure(s) Performed: Procedure(s): LAPAROSCOPIC REPAIR OF SUPRA UMBILICAL AND UMBILICAL  HERNIAS  AND PLACEMENT OF PAIN PUMP (N/A) INSERTION OF MESH (N/A)  Patient Location: PACU  Anesthesia Type:General  Level of Consciousness: awake, alert , oriented and patient cooperative  Airway & Oxygen Therapy: Patient Spontanous Breathing and Patient connected to face mask oxygen  Post-op Assessment: Report given to PACU RN, Post -op Vital signs reviewed and stable and Patient moving all extremities X 4  Post vital signs: stable  Complications: No apparent anesthesia complications

## 2013-02-24 NOTE — Anesthesia Postprocedure Evaluation (Signed)
Anesthesia Post Note  Patient: Lauren Avila  Procedure(s) Performed: Procedure(s) (LRB): LAPAROSCOPIC REPAIR OF SUPRA UMBILICAL AND UMBILICAL  HERNIAS  AND PLACEMENT OF PAIN PUMP (N/A) INSERTION OF MESH (N/A)  Anesthesia type: General  Patient location: PACU  Post pain: Pain level controlled  Post assessment: Post-op Vital signs reviewed  Last Vitals:  Filed Vitals:   02/24/13 1130  BP: 193/97  Pulse: 103  Temp:   Resp: 21    Post vital signs: Reviewed  Level of consciousness: sedated  Complications: No apparent anesthesia complications

## 2013-02-24 NOTE — Progress Notes (Signed)
Spoke with patient prior to discharge from PACU regarding need for follow-up regarding BP management. Patient understands and will seek appointment with PCP.

## 2013-02-24 NOTE — H&P (Signed)
Lauren Avila  07-28-1966 [id number]  CARE TEAM:  PCP: Lauren Avila  Outpatient Care Team: Patient Care Team: Lauren Height, PA-C as PCP - General (Nurse Practitioner) Lauren Rankins, MD as Consulting Physician (Obstetrics and Gynecology)  Inpatient Treatment Team: Treatment Team: Attending Provider: Ardeth Sportsman, MD  This patient is a 48 y.o.female who presents today for surgical evaluation for abdominal hernias  Pleasant obese female. She comes today with her husband. She noted a lump around her bellybutton when she was pregnant in 2011. It never really went away. Her gynecologist did an ultrasound since the patient was concerned about fibroids. Patient had few mild fibroids. Umbilical hernia suspected. Felt like it has gotten larger and more symptomatic. She notes a pulling type of pain. She feels like it swirls. Worse when she is stretching or reaching. Because it became more sensitive, she saw her primary care team. [name] was concerned about a worsening hernia and sent the patient to me for evaluation.  Patient normally has a bowel movement every day. No nausea or vomiting. No emergency room visits. Can walk at least 20 minutes without difficulty. No exertional pain and chest/arm/jaw. No prior cardiac history. Never smoked. Had C-sections x2. No complications related to that.   No new events   Past Medical History  Diagnosis Date  . GERD (gastroesophageal reflux disease)   . Hypertension   . Anxiety   . Sleep apnea     cpap    Past Surgical History  Procedure Laterality Date  . Cesarean section  1991, 2011    2x    History   Social History  . Marital Status: Married    Spouse Name: N/A    Number of Children: N/A  . Years of Education: N/A   Occupational History  . Not on file.   Social History Main Topics  . Smoking status: Never Smoker   . Smokeless tobacco: Never Used  . Alcohol Use: Yes     Comment: very rare  . Drug Use: No  . Sexually Active:  Not on file   Other Topics Concern  . Not on file   Social History Narrative  . No narrative on file    Family History  Problem Relation Age of Onset  . Diabetes Mother   . Hypertension Mother     Current Facility-Administered Medications  Medication Dose Route Frequency Provider Last Rate Last Dose  . ceFAZolin (ANCEF) IVPB 2 g/50 mL premix  2 g Intravenous 60 min Pre-Op Lauren Sportsman, MD      . chlorhexidine (HIBICLENS) 4 % liquid 1 application  1 application Topical Once Lauren Sportsman, MD      . Melene Muller ON 02/25/2013] chlorhexidine (HIBICLENS) 4 % liquid 1 application  1 application Topical Once Lauren Sportsman, MD         No Known Allergies  ROS: Constitutional:  No fevers, chills, sweats.  Weight stable Eyes:  No vision changes, No discharge HENT:  No sore throats, nasal drainage Lymph: No neck swelling, No bruising easily Pulmonary:  No cough, productive sputum CV: No orthopnea, PND  Patient walks 30 minutes for about 1 miles without difficulty.  No exertional chest/neck/shoulder/arm pain. GI: No personal nor family history of GI/colon cancer, inflammatory bowel disease, irritable bowel syndrome, allergy such as Celiac Sprue, dietary/dairy problems, colitis, ulcers nor gastritis.  No recent sick contacts/gastroenteritis.  No travel outside the country.  No changes in diet. Renal: No UTIs, No hematuria Genital:  No drainage,  bleeding, masses Musculoskeletal: No severe joint pain.  Good ROM major joints Skin:  No sores or lesions.  No rashes Heme/Lymph:  No easy bleeding.  No swollen lymph nodes Neuro: No focal weakness/numbness.  No seizures Psych: No suicidal ideation.  No hallucinations  BP 180/95  Pulse 67  Temp(Src) 98 F (36.7 C) (Oral)  Resp 16  SpO2 97%  Physical Exam: General: Pt awake/alert/oriented x4 in no major acute distress Eyes: PERRL, normal EOM. Sclera nonicteric Neuro: CN II-XII intact w/o focal sensory/motor deficits. Lymph: No  head/neck/groin lymphadenopathy Psych:  No delerium/psychosis/paranoia HENT: Normocephalic, Mucus membranes moist.  No thrush Neck: Supple, No tracheal deviation Chest: No pain.  Good respiratory excursion. CV:  Pulses intact.  Regular rhythm Abdomen: Soft, Nondistended.  Nontender.  Supraumbilical mass c/w incarcerated hernia.  Umb hernia Ext:  SCDs BLE.  No significant edema.  No cyanosis Skin: No petechiae / purpurea.  No major sores Musculoskeletal: No severe joint pain.  Good ROM major joints   Results:   Labs: No results found for this or any previous visit (from the past 48 hour(s)).  Imaging / Studies: Dg Chest 2 View  02/17/2013   *RADIOLOGY REPORT*  Clinical Data: Hypertension; preoperative abdominal hernia  CHEST - 2 VIEW  Comparison: None.  Findings: Lungs are clear.  Heart size and pulmonary vascularity are normal.  No adenopathy.  No bone lesions.  IMPRESSION: No abnormality noted.   Original Report Authenticated By: Bretta Bang, M.D.    Medications / Allergies: per chart  Antibiotics: Anti-infectives   Start     Dose/Rate Route Frequency Ordered Stop   02/24/13 0605  ceFAZolin (ANCEF) IVPB 2 g/50 mL premix     2 g 100 mL/hr over 30 Minutes Intravenous 60 min pre-op 02/24/13 0606        Assessment  Lauren Avila  47 y.o. female  Day of Surgery  Procedure(s): LAPAROSCOPIC EXPLORATION & REPAIR OF HERNIAS IN ABDOMEN INSERTION OF MESH  Problem List:  Active Problems:   * No active hospital problems. *  Incarcerated epigastric and small reducible umbilical ventral hernias   Plan:   I think she would benefit from laparoscopic exploration and repair. Given her obesity, underlay repair with mesh a good idea. Hopefully just a 15 cm sheet. Hopefully go home that day as an outpatient.   The anatomy & physiology of the abdominal wall was discussed. The pathophysiology of hernias was discussed. Natural history risks without surgery including progeressive  enlargement, pain, incarceration & strangulation was discussed. Contributors to complications such as smoking, obesity, diabetes, prior surgery, etc were discussed.  I feel the risks of no intervention will lead to serious problems that outweigh the operative risks; therefore, I recommended surgery to reduce and repair the hernia. I explained laparoscopic techniques with possible need for an open approach. I noted the probable use of mesh to patch and/or buttress the hernia repair.   Risks such as bleeding, infection, abscess, need for further treatment, heart attack, death, and other risks were discussed. I noted a good likelihood this will help address the problem. Goals of post-operative recovery were discussed as well. Possibility that this will not correct all symptoms was explained. I stressed the importance of low-impact activity, aggressive pain control, avoiding constipation, & not pushing through pain to minimize risk of post-operative chronic pain or injury. Possibility of reherniation especially with smoking, obesity, diabetes, immunosuppression, and other health conditions was discussed. We will work to minimize complications.  An educational handout further explaining  the pathology & treatment options was given as well. Questions were answered. The patient expresses understanding & wishes to proceed with surgery.    Lauren Avila, M.D., F.A.C.S. Gastrointestinal and Minimally Invasive Surgery [facility] [address] [place], [place] [address] [phone] Main / Paging   02/24/2013

## 2013-02-24 NOTE — Progress Notes (Signed)
Apresoline 5 mg IVP given as ordered for elevated blood pressure per  Dr. Rica Mast.

## 2013-02-24 NOTE — Progress Notes (Signed)
Dr. Rica Mast made aware of patient's blood pressures after Apresoline   Dosages completed.

## 2013-02-24 NOTE — Progress Notes (Signed)
Paged dr gross re: pt request work excuse

## 2013-02-24 NOTE — Progress Notes (Signed)
Dr. Rica Mast in- talked with patient- aware of patient's blood pressures and heart rates- O.K. To go to Short Stay.

## 2013-02-25 ENCOUNTER — Telehealth (INDEPENDENT_AMBULATORY_CARE_PROVIDER_SITE_OTHER): Payer: Self-pay | Admitting: General Surgery

## 2013-02-25 ENCOUNTER — Encounter (HOSPITAL_COMMUNITY): Payer: Self-pay | Admitting: Surgery

## 2013-02-25 NOTE — Telephone Encounter (Signed)
Spoke with pt and informed her that her appt w/ Dr. Michaell Cowing is on 03/16/13 at 10:00 w/ arrival time of 9:45.

## 2013-02-26 ENCOUNTER — Telehealth (INDEPENDENT_AMBULATORY_CARE_PROVIDER_SITE_OTHER): Payer: Self-pay

## 2013-02-26 NOTE — Telephone Encounter (Signed)
I have tried multiple time to reach the pt but her phone continues to be busy. I will keep trying to call her b/c I want to check on her after surgery.

## 2013-03-03 NOTE — Telephone Encounter (Signed)
Spoke with pt to see how she is doing after surgery.  She said that she is doing very well and that each day gets better.  I explained to her that this was wonderful and that she should just keep in mind not to push through pain and to try and take it easy for a couple of days.  She wanted to let us know that she is going back to work next week and she wanted to know if there were any restrictions.  I explained that she could not drive if she was taking any of the narcotics but that she could use ibuprofen in between for breakthrough pain.  Also explained no pushing, pulling, or lifting anything over 15 lbs for a couple of weeks.  She said this would be fine.

## 2013-03-16 ENCOUNTER — Ambulatory Visit (INDEPENDENT_AMBULATORY_CARE_PROVIDER_SITE_OTHER): Payer: 59 | Admitting: Surgery

## 2013-03-16 ENCOUNTER — Encounter (INDEPENDENT_AMBULATORY_CARE_PROVIDER_SITE_OTHER): Payer: Self-pay | Admitting: Surgery

## 2013-03-16 VITALS — BP 122/80 | HR 74 | Resp 16 | Ht 60.0 in | Wt 194.8 lb

## 2013-03-16 DIAGNOSIS — K436 Other and unspecified ventral hernia with obstruction, without gangrene: Secondary | ICD-10-CM

## 2013-03-16 DIAGNOSIS — K429 Umbilical hernia without obstruction or gangrene: Secondary | ICD-10-CM

## 2013-03-16 NOTE — Progress Notes (Signed)
Subjective:     Patient ID: Lauren Avila, female   DOB: 1966/06/07, 47 y.o.   MRN: [id number]  HPI  Lauren Avila  06-Mar-1966 [id number]  Patient Care Team: Milus Height, PA-C as PCP - General (Nurse Practitioner) Geryl Rankins, MD as Consulting Physician (Obstetrics and Gynecology)  This patient is a 47 y.o.female who presents today for surgical evaluation.  DOS: 02/24/2013  POST-OPERATIVE DIAGNOSIS: Incarcerated supraumbilical hernia and umbilical hernia   PROCEDURE: Procedure(s):  LAPAROSCOPIC REPAIR OF SUPRA UMBILICAL AND UMBILICAL HERNIAS  PLACEMENT OF OnQ PUMP  INSERTION OF MESH  SURGEON: Surgeon(s):  Ardeth Sportsman, MD   The patient comes in today feeling well.  Soreness rapidly went down.  Sleeping better.  Walking well.  Wanting to do more aggressive exercise to lose weight.  She comes today with her daughter.  Energy level good.  She says this is the best she has felt in a while.  Some soreness in the lower midline region near her belt line.  Patient Active Problem List   Diagnosis Date Noted  . Obesity (BMI 30-39.9) 12/03/2012  . Incarcerated epigastric hernia 12/03/2012  . Umbilical hernia 12/03/2012    Past Medical History  Diagnosis Date  . GERD (gastroesophageal reflux disease)   . Hypertension   . Anxiety   . Sleep apnea     cpap    Past Surgical History  Procedure Laterality Date  . Cesarean section  1991, 2011    2x  . Umbilical hernia repair N/A 02/24/2013    Procedure: LAPAROSCOPIC REPAIR OF SUPRA UMBILICAL AND UMBILICAL  HERNIAS  AND PLACEMENT OF PAIN PUMP;  Surgeon: Ardeth Sportsman, MD;  Location: [facility];  Service: General;  Laterality: N/A;  . Insertion of mesh N/A 02/24/2013    Procedure: INSERTION OF MESH;  Surgeon: Ardeth Sportsman, MD;  Location: [facility];  Service: General;  Laterality: N/A;    History   Social History  . Marital Status: Married    Spouse Name: N/A    Number of Children: N/A  . Years of Education: N/A   Occupational  History  . Not on file.   Social History Main Topics  . Smoking status: Never Smoker   . Smokeless tobacco: Never Used  . Alcohol Use: Yes     Comment: very rare  . Drug Use: No  . Sexual Activity: Not on file   Other Topics Concern  . Not on file   Social History Narrative  . No narrative on file    Family History  Problem Relation Age of Onset  . Diabetes Mother   . Hypertension Mother     Current Outpatient Prescriptions  Medication Sig Dispense Refill  . esomeprazole (NEXIUM) 40 MG capsule Take 40 mg by mouth daily before breakfast.      . labetalol (NORMODYNE) 100 MG tablet Take 100 mg by mouth 2 (two) times daily.      . naproxen (NAPROSYN) 500 MG tablet Take 1 tablet (500 mg total) by mouth 2 (two) times daily with a meal.  40 tablet  1  . nystatin-triamcinolone ointment (MYCOLOG) Apply topically 2 (two) times daily.      Marland Kitchen oxyCODONE (OXY IR/ROXICODONE) 5 MG immediate release tablet Take 1-2 tablets (5-10 mg total) by mouth every 4 (four) hours as needed for pain.  50 tablet  0  . ranitidine (ZANTAC) 150 MG tablet Take 150 mg by mouth 2 (two) times daily.       No current facility-administered medications  for this visit.     No Known Allergies  There were no vitals taken for this visit.  Dg Chest 2 View  02/17/2013   *RADIOLOGY REPORT*  Clinical Data: Hypertension; preoperative abdominal hernia  CHEST - 2 VIEW  Comparison: None.  Findings: Lungs are clear.  Heart size and pulmonary vascularity are normal.  No adenopathy.  No bone lesions.  IMPRESSION: No abnormality noted.   Original Report Authenticated By: Bretta Bang, M.D.     Review of Systems  Constitutional: Negative for fever, chills and diaphoresis.  HENT: Negative for ear pain, sore throat and trouble swallowing.   Eyes: Negative for photophobia and visual disturbance.  Respiratory: Negative for cough and choking.   Cardiovascular: Negative for chest pain and palpitations.  Gastrointestinal:  Negative for nausea, vomiting, abdominal pain, diarrhea, constipation, anal bleeding and rectal pain.  Genitourinary: Negative for dysuria, frequency and difficulty urinating.  Musculoskeletal: Negative for myalgias and gait problem.  Skin: Negative for color change, pallor and rash.  Neurological: Negative for dizziness, speech difficulty, weakness and numbness.  Hematological: Negative for adenopathy.  Psychiatric/Behavioral: Negative for confusion and agitation. The patient is not nervous/anxious.        Objective:   Physical Exam  Constitutional: She is oriented to person, place, and time. She appears well-developed and well-nourished. No distress.  HENT:  Head: Normocephalic.  Mouth/Throat: Oropharynx is clear and moist. No oropharyngeal exudate.  Eyes: Conjunctivae and EOM are normal. Pupils are equal, round, and reactive to light. No scleral icterus.  Neck: Normal range of motion. No tracheal deviation present.  Cardiovascular: Normal rate and intact distal pulses.   Pulmonary/Chest: Effort normal. No respiratory distress. She exhibits no tenderness.  Abdominal: Soft. She exhibits no distension. There is no tenderness. Hernia confirmed negative in the right inguinal area and confirmed negative in the left inguinal area.    Incisions clean with normal healing ridges.  No hernias  Genitourinary: No vaginal discharge found.  Musculoskeletal: Normal range of motion. She exhibits no tenderness.  Lymphadenopathy:       Right: No inguinal adenopathy present.       Left: No inguinal adenopathy present.  Neurological: She is alert and oriented to person, place, and time. No cranial nerve deficit. She exhibits normal muscle tone. Coordination normal.  Skin: Skin is warm and dry. No rash noted. She is not diaphoretic.  Psychiatric: She has a normal mood and affect. Her behavior is normal.       Assessment:     Recovering well only three weeks status post laparoscopic ventral wall  hernia repair of two hernias.     Plan:     Increase activity as tolerated to regular activity.  Low impact exercise such as walking an hour a day at least ideal.  Okay to do more intensive exercise as long as she does not push through pain.  Do not push through pain.  Diet as tolerated.  Low fat high fiber diet ideal.  Bowel regimen with 30 g fiber a day and fiber supplement as needed to avoid problems.  Return to clinic as needed.   Instructions discussed.  Followup with primary care physician for other health issues as would normally be done.  Questions answered.  The patient expressed understanding and appreciation

## 2013-03-16 NOTE — Patient Instructions (Addendum)
Managing Pain  Pain after surgery or related to activity is often due to strain/injury to muscle, tendon, nerves and/or incisions.  This pain is usually short-term and will improve in a few months.   Many people find it helpful to do the following things TOGETHER to help speed the process of healing and to get back to regular activity more quickly:  1. Avoid heavy physical activity a.  no lifting greater than 20 pounds b. Do not "push through" the pain.  Listen to your body and avoid positions and maneuvers than reproduce the pain c. Walking is okay as tolerated, but go slowly and stop when getting sore.  d. Remember: If it hurts to do it, then don't do it! 2. Take Anti-inflammatory medication  a. Take with food/snack around the clock for 1-2 weeks i. This helps the muscle and nerve tissues become less irritable and calm down faster b. Choose ONE of the following over-the-counter medications: i. Naproxen 220mg  tabs (ex. Aleve) 1-2 pills twice a day  ii. Ibuprofen 200mg  tabs (ex. Advil, Motrin) 3-4 pills with every meal and just before bedtime iii. Acetaminophen 500mg  tabs (Tylenol) 1-2 pills with every meal and just before bedtime 3. Use a Heating pad or Ice/Cold Pack a. 4-6 times a day b. May use warm bath/hottub  or showers 4. Try Gentle Massage and/or Stretching  a. at the area of pain many times a day b. stop if you feel pain - do not overdo it  Try these steps together to help you body heal faster and avoid making things get worse.  Doing just one of these things may not be enough.    If you are not getting better after two weeks or are noticing you are getting worse, contact our office for further advice; we may need to re-evaluate you & see what other things we can do to help.  Exercise to Lose Weight Exercise and a healthy diet may help you lose weight. Your doctor may suggest specific exercises. EXERCISE IDEAS AND TIPS  Choose low-cost things you enjoy doing, such as  walking, bicycling, or exercising to workout videos.  Take stairs instead of the elevator.  Walk during your lunch break.  Park your car further away from work or school.  Go to a gym or an exercise class.  Start with 5 to 10 minutes of exercise each day. Build up to 30 minutes of exercise 4 to 6 days a week.  Wear shoes with good support and comfortable clothes.  Stretch before and after working out.  Work out until you breathe harder and your heart beats faster.  Drink extra water when you exercise.  Do not do so much that you hurt yourself, feel dizzy, or get very short of breath. Exercises that burn about 150 calories:  Running 1  miles in 15 minutes.  Playing volleyball for 45 to 60 minutes.  Washing and waxing a car for 45 to 60 minutes.  Playing touch football for 45 minutes.  Walking 1  miles in 35 minutes.  Pushing a stroller 1  miles in 30 minutes.  Playing basketball for 30 minutes.  Raking leaves for 30 minutes.  Bicycling 5 miles in 30 minutes.  Walking 2 miles in 30 minutes.  Dancing for 30 minutes.  Shoveling snow for 15 minutes.  Swimming laps for 20 minutes.  Walking up stairs for 15 minutes.  Bicycling 4 miles in 15 minutes.  Gardening for 30 to 45 minutes.  Jumping rope for  15 minutes.  Washing windows or floors for 45 to 60 minutes. Document Released: 08/18/2010 Document Revised: 10/08/2011 Document Reviewed: 08/18/2010 [facility] Patient Information 2014 [place], [place].

## 2013-11-09 ENCOUNTER — Other Ambulatory Visit: Payer: Self-pay

## 2013-11-09 DIAGNOSIS — Z1231 Encounter for screening mammogram for malignant neoplasm of breast: Secondary | ICD-10-CM

## 2013-11-18 ENCOUNTER — Encounter (INDEPENDENT_AMBULATORY_CARE_PROVIDER_SITE_OTHER): Payer: Self-pay

## 2013-11-18 ENCOUNTER — Ambulatory Visit
Admission: RE | Admit: 2013-11-18 | Discharge: 2013-11-18 | Disposition: A | Discharge status: Home / Self Care | Payer: 59 | Source: Ambulatory Visit

## 2013-11-18 DIAGNOSIS — Z1231 Encounter for screening mammogram for malignant neoplasm of breast: Secondary | ICD-10-CM

## 2013-11-20 ENCOUNTER — Other Ambulatory Visit: Payer: Self-pay | Admitting: Obstetrics and Gynecology

## 2013-11-20 DIAGNOSIS — R928 Other abnormal and inconclusive findings on diagnostic imaging of breast: Secondary | ICD-10-CM

## 2013-12-02 ENCOUNTER — Ambulatory Visit
Admission: RE | Admit: 2013-12-02 | Discharge: 2013-12-02 | Disposition: A | Discharge status: Home / Self Care | Payer: 59 | Source: Ambulatory Visit | Attending: Obstetrics and Gynecology | Admitting: Obstetrics and Gynecology

## 2013-12-02 DIAGNOSIS — R928 Other abnormal and inconclusive findings on diagnostic imaging of breast: Secondary | ICD-10-CM

## 2014-08-23 ENCOUNTER — Telehealth: Payer: Self-pay

## 2014-08-23 NOTE — Telephone Encounter (Signed)
Error

## 2015-04-27 DIAGNOSIS — N393 Stress incontinence (female) (male): Secondary | ICD-10-CM | POA: Insufficient documentation

## 2015-08-11 LAB — HM COLONOSCOPY

## 2015-11-15 ENCOUNTER — Other Ambulatory Visit: Payer: Self-pay | Admitting: Obstetrics and Gynecology

## 2015-11-15 ENCOUNTER — Other Ambulatory Visit (HOSPITAL_COMMUNITY)
Admission: RE | Admit: 2015-11-15 | Discharge: 2015-11-15 | Disposition: A | Discharge status: Home / Self Care | Source: Ambulatory Visit | Attending: Obstetrics and Gynecology | Admitting: Obstetrics and Gynecology

## 2015-11-15 DIAGNOSIS — Z01419 Encounter for gynecological examination (general) (routine) without abnormal findings: Secondary | ICD-10-CM | POA: Insufficient documentation

## 2015-11-15 DIAGNOSIS — Z1151 Encounter for screening for human papillomavirus (HPV): Secondary | ICD-10-CM | POA: Insufficient documentation

## 2015-11-17 LAB — CYTOLOGY - PAP

## 2017-01-15 DIAGNOSIS — G4733 Obstructive sleep apnea (adult) (pediatric): Secondary | ICD-10-CM | POA: Diagnosis not present

## 2017-02-06 DIAGNOSIS — G4733 Obstructive sleep apnea (adult) (pediatric): Secondary | ICD-10-CM | POA: Diagnosis not present

## 2017-03-14 DIAGNOSIS — L918 Other hypertrophic disorders of the skin: Secondary | ICD-10-CM | POA: Diagnosis not present

## 2017-03-14 DIAGNOSIS — G4733 Obstructive sleep apnea (adult) (pediatric): Secondary | ICD-10-CM | POA: Diagnosis not present

## 2017-03-14 DIAGNOSIS — R635 Abnormal weight gain: Secondary | ICD-10-CM | POA: Diagnosis not present

## 2017-03-14 DIAGNOSIS — K219 Gastro-esophageal reflux disease without esophagitis: Secondary | ICD-10-CM | POA: Diagnosis not present

## 2017-03-14 DIAGNOSIS — L659 Nonscarring hair loss, unspecified: Secondary | ICD-10-CM | POA: Diagnosis not present

## 2017-03-14 DIAGNOSIS — Z0000 Encounter for general adult medical examination without abnormal findings: Secondary | ICD-10-CM | POA: Diagnosis not present

## 2017-03-14 DIAGNOSIS — I10 Essential (primary) hypertension: Secondary | ICD-10-CM | POA: Diagnosis not present

## 2017-04-09 DIAGNOSIS — L649 Androgenic alopecia, unspecified: Secondary | ICD-10-CM | POA: Diagnosis not present

## 2017-04-09 MED FILL — SPIRONOLACTONE 100 MG TAB: 100 | 30 days supply | Qty: 30 | Fill #0

## 2017-04-10 ENCOUNTER — Other Ambulatory Visit (HOSPITAL_COMMUNITY)
Admission: RE | Admit: 2017-04-10 | Discharge: 2017-04-10 | Disposition: A | Discharge status: Home / Self Care | Payer: 59 | Source: Ambulatory Visit | Attending: Dermatology | Admitting: Dermatology

## 2017-04-10 DIAGNOSIS — E611 Iron deficiency: Secondary | ICD-10-CM | POA: Insufficient documentation

## 2017-04-10 LAB — CBC WITH DIFFERENTIAL/PLATELET
BASOS PCT: 0 %
Basophils Absolute: 0 10*3/uL (ref 0.0–0.1)
EOS ABS: 0.1 10*3/uL (ref 0.0–0.7)
Eosinophils Relative: 2 %
HCT: 39 % (ref 36.0–46.0)
HEMOGLOBIN: 13.1 g/dL (ref 12.0–15.0)
LYMPHS ABS: 2.3 10*3/uL (ref 0.7–4.0)
Lymphocytes Relative: 34 %
MCH: 27.6 pg (ref 26.0–34.0)
MCHC: 33.6 g/dL (ref 30.0–36.0)
MCV: 82.3 fL (ref 78.0–100.0)
MONO ABS: 0.5 10*3/uL (ref 0.1–1.0)
MONOS PCT: 7 %
NEUTROS ABS: 3.8 10*3/uL (ref 1.7–7.7)
Neutrophils Relative %: 57 %
PLATELETS: 357 10*3/uL (ref 150–400)
RBC: 4.74 MIL/uL (ref 3.87–5.11)
RDW: 13.9 % (ref 11.5–15.5)
WBC: 6.8 10*3/uL (ref 4.0–10.5)

## 2017-04-10 LAB — FERRITIN: FERRITIN: 78 ng/mL (ref 11–307)

## 2017-07-16 DIAGNOSIS — L668 Other cicatricial alopecia: Secondary | ICD-10-CM | POA: Diagnosis not present

## 2017-07-16 DIAGNOSIS — L649 Androgenic alopecia, unspecified: Secondary | ICD-10-CM | POA: Diagnosis not present

## 2017-07-16 MED FILL — FLUOCINOLONE 0.01% SCALP OI: 0.01 | 30 days supply | Qty: 118 | Fill #0

## 2017-08-13 MED FILL — PANTOPRAZOLE SOD DR 40 MG T: 40 | 90 days supply | Qty: 90 | Fill #0

## 2017-09-19 MED FILL — CHLORHEXIDINE 0.12% RINSE: 0.12 | 16 days supply | Qty: 473 | Fill #0

## 2017-09-23 DIAGNOSIS — L659 Nonscarring hair loss, unspecified: Secondary | ICD-10-CM | POA: Diagnosis not present

## 2017-09-23 DIAGNOSIS — I10 Essential (primary) hypertension: Secondary | ICD-10-CM | POA: Diagnosis not present

## 2017-09-23 DIAGNOSIS — G4733 Obstructive sleep apnea (adult) (pediatric): Secondary | ICD-10-CM | POA: Diagnosis not present

## 2017-09-23 DIAGNOSIS — Z6839 Body mass index (BMI) 39.0-39.9, adult: Secondary | ICD-10-CM | POA: Diagnosis not present

## 2017-09-23 DIAGNOSIS — B354 Tinea corporis: Secondary | ICD-10-CM | POA: Diagnosis not present

## 2017-09-23 DIAGNOSIS — E669 Obesity, unspecified: Secondary | ICD-10-CM | POA: Diagnosis not present

## 2017-09-23 MED FILL — NYSTATIN-TRIAMCINOLONE OINT: 100000-0.1 | 15 days supply | Qty: 30 | Fill #0

## 2017-09-24 MED FILL — AMOXICILLIN 500 MG CAPSULE: 500 | 7 days supply | Qty: 21 | Fill #0

## 2017-10-15 DIAGNOSIS — L659 Nonscarring hair loss, unspecified: Secondary | ICD-10-CM | POA: Diagnosis not present

## 2017-10-15 DIAGNOSIS — L649 Androgenic alopecia, unspecified: Secondary | ICD-10-CM | POA: Diagnosis not present

## 2017-11-14 MED FILL — METOPROLOL SUCC ER 50 MG TA: 50 | 90 days supply | Qty: 90 | Fill #0

## 2017-11-14 MED FILL — AMLODIPINE BESYLATE 5 MG TA: 5 | 90 days supply | Qty: 90 | Fill #0

## 2018-02-04 MED FILL — PANTOPRAZOLE SOD DR 40 MG T: 40 | 90 days supply | Qty: 90 | Fill #1

## 2018-02-10 MED FILL — CLINDAMYCIN HCL 300 MG CAPS: 300 | 7 days supply | Qty: 21 | Fill #0

## 2018-02-28 MED FILL — AMLODIPINE BESYLATE 5 MG TA: 5 | 90 days supply | Qty: 90 | Fill #1

## 2018-02-28 MED FILL — METOPROLOL SUCCINATE ER 50: 50 | 90 days supply | Qty: 90 | Fill #1

## 2018-03-13 DIAGNOSIS — G4733 Obstructive sleep apnea (adult) (pediatric): Secondary | ICD-10-CM | POA: Diagnosis not present

## 2018-05-09 MED FILL — PANTOPRAZOLE SOD DR 40 MG T: 40 | 90 days supply | Qty: 90 | Fill #2

## 2018-05-13 DIAGNOSIS — G4733 Obstructive sleep apnea (adult) (pediatric): Secondary | ICD-10-CM | POA: Diagnosis not present

## 2018-05-26 DIAGNOSIS — L649 Androgenic alopecia, unspecified: Secondary | ICD-10-CM | POA: Diagnosis not present

## 2018-05-26 DIAGNOSIS — D485 Neoplasm of uncertain behavior of skin: Secondary | ICD-10-CM | POA: Diagnosis not present

## 2018-05-26 DIAGNOSIS — L7211 Pilar cyst: Secondary | ICD-10-CM | POA: Diagnosis not present

## 2018-05-26 DIAGNOSIS — L668 Other cicatricial alopecia: Secondary | ICD-10-CM | POA: Diagnosis not present

## 2018-06-13 DIAGNOSIS — G4733 Obstructive sleep apnea (adult) (pediatric): Secondary | ICD-10-CM | POA: Diagnosis not present

## 2018-06-24 MED FILL — AMLODIPINE BESYLATE 5 MG TA: 5 | 90 days supply | Qty: 90 | Fill #0

## 2018-06-24 MED FILL — METOPROLOL SUCCINATE ER 50: 50 | 90 days supply | Qty: 90 | Fill #0

## 2018-07-02 ENCOUNTER — Other Ambulatory Visit: Payer: Self-pay | Admitting: Obstetrics and Gynecology

## 2018-07-02 DIAGNOSIS — Z1231 Encounter for screening mammogram for malignant neoplasm of breast: Secondary | ICD-10-CM

## 2018-07-13 DIAGNOSIS — G4733 Obstructive sleep apnea (adult) (pediatric): Secondary | ICD-10-CM | POA: Diagnosis not present

## 2018-08-11 DIAGNOSIS — I10 Essential (primary) hypertension: Secondary | ICD-10-CM | POA: Diagnosis not present

## 2018-08-11 DIAGNOSIS — G4733 Obstructive sleep apnea (adult) (pediatric): Secondary | ICD-10-CM | POA: Diagnosis not present

## 2018-08-11 DIAGNOSIS — K219 Gastro-esophageal reflux disease without esophagitis: Secondary | ICD-10-CM | POA: Diagnosis not present

## 2018-08-11 DIAGNOSIS — Z0000 Encounter for general adult medical examination without abnormal findings: Secondary | ICD-10-CM | POA: Diagnosis not present

## 2018-08-11 DIAGNOSIS — Z1159 Encounter for screening for other viral diseases: Secondary | ICD-10-CM | POA: Diagnosis not present

## 2018-08-11 DIAGNOSIS — E669 Obesity, unspecified: Secondary | ICD-10-CM | POA: Diagnosis not present

## 2018-08-13 ENCOUNTER — Ambulatory Visit
Admission: RE | Admit: 2018-08-13 | Discharge: 2018-08-13 | Disposition: A | Discharge status: Home / Self Care | Payer: 59 | Source: Ambulatory Visit | Attending: Obstetrics and Gynecology | Admitting: Obstetrics and Gynecology

## 2018-08-13 DIAGNOSIS — Z1231 Encounter for screening mammogram for malignant neoplasm of breast: Secondary | ICD-10-CM

## 2018-08-13 DIAGNOSIS — G4733 Obstructive sleep apnea (adult) (pediatric): Secondary | ICD-10-CM | POA: Diagnosis not present

## 2018-08-22 MED FILL — PANTOPRAZOLE SOD DR 40 MG T: 40 | 90 days supply | Qty: 90 | Fill #0

## 2018-09-01 DIAGNOSIS — Z124 Encounter for screening for malignant neoplasm of cervix: Secondary | ICD-10-CM | POA: Diagnosis not present

## 2018-09-01 DIAGNOSIS — Z01411 Encounter for gynecological examination (general) (routine) with abnormal findings: Secondary | ICD-10-CM | POA: Diagnosis not present

## 2018-09-13 DIAGNOSIS — G4733 Obstructive sleep apnea (adult) (pediatric): Secondary | ICD-10-CM | POA: Diagnosis not present

## 2018-09-16 ENCOUNTER — Other Ambulatory Visit: Payer: Self-pay | Admitting: Obstetrics and Gynecology

## 2018-09-16 ENCOUNTER — Other Ambulatory Visit (HOSPITAL_COMMUNITY)
Admission: RE | Admit: 2018-09-16 | Discharge: 2018-09-16 | Disposition: A | Discharge status: Home / Self Care | Payer: 59 | Source: Ambulatory Visit | Attending: Obstetrics and Gynecology | Admitting: Obstetrics and Gynecology

## 2018-09-16 DIAGNOSIS — Z124 Encounter for screening for malignant neoplasm of cervix: Secondary | ICD-10-CM | POA: Diagnosis not present

## 2018-09-16 DIAGNOSIS — Z01419 Encounter for gynecological examination (general) (routine) without abnormal findings: Secondary | ICD-10-CM | POA: Insufficient documentation

## 2018-09-16 LAB — RESULTS CONSOLE HPV: CHL HPV: NEGATIVE

## 2018-09-16 LAB — HM PAP SMEAR: HM Pap smear: NEGATIVE

## 2018-09-18 LAB — CYTOLOGY - PAP
Diagnosis: NEGATIVE
HPV: NOT DETECTED

## 2018-09-24 MED FILL — AMLODIPINE BESYLATE 5 MG TA: 5 | 90 days supply | Qty: 90 | Fill #0

## 2018-09-24 MED FILL — METOPROLOL SUCCINATE ER 50: 50 | 90 days supply | Qty: 90 | Fill #0

## 2018-09-26 DIAGNOSIS — R748 Abnormal levels of other serum enzymes: Secondary | ICD-10-CM | POA: Diagnosis not present

## 2018-10-12 DIAGNOSIS — G4733 Obstructive sleep apnea (adult) (pediatric): Secondary | ICD-10-CM | POA: Diagnosis not present

## 2018-11-14 MED FILL — PANTOPRAZOLE SOD DR 40 MG T: 40 | 90 days supply | Qty: 90 | Fill #1

## 2018-12-17 MED FILL — AMLODIPINE BESYLATE 5 MG TA: 5 | 90 days supply | Qty: 90 | Fill #1

## 2018-12-17 MED FILL — METOPROLOL SUCCINATE ER 50: 50 | 90 days supply | Qty: 90 | Fill #1

## 2019-01-06 ENCOUNTER — Emergency Department (HOSPITAL_COMMUNITY)
Admission: EM | Admit: 2019-01-06 | Discharge: 2019-01-06 | Disposition: A | Discharge status: Home / Self Care | Payer: 59 | Attending: Emergency Medicine | Admitting: Emergency Medicine

## 2019-01-06 ENCOUNTER — Encounter (HOSPITAL_COMMUNITY): Payer: Self-pay

## 2019-01-06 ENCOUNTER — Other Ambulatory Visit: Payer: Self-pay

## 2019-01-06 DIAGNOSIS — Z79899 Other long term (current) drug therapy: Secondary | ICD-10-CM | POA: Diagnosis not present

## 2019-01-06 DIAGNOSIS — T23162A Burn of first degree of back of left hand, initial encounter: Secondary | ICD-10-CM | POA: Insufficient documentation

## 2019-01-06 DIAGNOSIS — Y929 Unspecified place or not applicable: Secondary | ICD-10-CM | POA: Insufficient documentation

## 2019-01-06 DIAGNOSIS — T23102A Burn of first degree of left hand, unspecified site, initial encounter: Secondary | ICD-10-CM | POA: Diagnosis not present

## 2019-01-06 DIAGNOSIS — X153XXA Contact with hot saucepan or skillet, initial encounter: Secondary | ICD-10-CM | POA: Diagnosis not present

## 2019-01-06 DIAGNOSIS — Y93G3 Activity, cooking and baking: Secondary | ICD-10-CM | POA: Diagnosis not present

## 2019-01-06 DIAGNOSIS — T300 Burn of unspecified body region, unspecified degree: Secondary | ICD-10-CM

## 2019-01-06 DIAGNOSIS — T23002A Burn of unspecified degree of left hand, unspecified site, initial encounter: Secondary | ICD-10-CM | POA: Diagnosis present

## 2019-01-06 DIAGNOSIS — Y999 Unspecified external cause status: Secondary | ICD-10-CM | POA: Diagnosis not present

## 2019-01-06 DIAGNOSIS — T310 Burns involving less than 10% of body surface: Secondary | ICD-10-CM | POA: Diagnosis not present

## 2019-01-06 MED ORDER — BACITRACIN ZINC 500 UNIT/GM EX OINT
TOPICAL_OINTMENT | Freq: Once | CUTANEOUS | Status: AC
  Administered 2019-01-06: 1 via TOPICAL
  Filled 2019-01-06: qty 0.9

## 2019-01-06 NOTE — ED Notes (Signed)
Bed: [facility] Expected date:  Expected time:  Means of arrival:  Comments: 

## 2019-01-06 NOTE — ED Triage Notes (Signed)
patient c/o left hand burn when she was trying to get something out of the oven last night at 1830. Patient states she treated it with ice and cold water.

## 2019-01-06 NOTE — Discharge Instructions (Addendum)
You have been seen today for hand burn. Please read and follow all provided instructions. Return to the emergency room for worsening condition or new concerning symptoms.    1. Medications:  Continue usual home medications Take medications as prescribed. Please review all of the medicines and only take them if you do not have an allergy to them.   2. Treatment: rest, drink plenty of fluids -Apply bacitracin ointment to the wound on your hand daily.  3. Follow Up: Please follow up with your primary doctor in 2-5 days for discussion of your diagnoses and further evaluation after today's visit; Call today to arrange your follow up.    ?

## 2019-01-06 NOTE — ED Provider Notes (Signed)
[facility] Provider Note   CSN: [id number] Arrival date & time: 01/06/19  1406    History   Chief Complaint Chief Complaint  Patient presents with  . Hand Burn    HPI Lauren Avila is a 53 y.o. female history of hypertension presented to emergency department today with chief complaint of burn to left hand x2 days.  Patient states last night she was taking a pan of bacon out of the oven when the pan accidentally touched her left hand.  She felt immediate burning pain to the top of her left hand.  The pain does not radiate.  She states the pain is been constant and she rates it 4 out of 10 in severity.  She has been icing her hand and taking Tylenol for pain with symptom relief. Denies any numbness, weakness, tingling, fever, chills. Patient is right-hand dominant.    Past Medical History:  Diagnosis Date  . Anxiety   . GERD (gastroesophageal reflux disease)   . Hypertension   . Sleep apnea    cpap    Patient Active Problem List   Diagnosis Date Noted  . Obesity (BMI 30-39.9) 12/03/2012  . Incarcerated epigastric hernia 12/03/2012  . Umbilical hernia 39/76/7341    Past Surgical History:  Procedure Laterality Date  . [place], 2011   2x  . INSERTION OF MESH N/A 02/24/2013   Procedure: INSERTION OF MESH;  Surgeon: Adin Hector, MD;  Location: [facility];  Service: General;  Laterality: N/A;  . UMBILICAL HERNIA REPAIR N/A 02/24/2013   Procedure: LAPAROSCOPIC REPAIR OF SUPRA UMBILICAL AND UMBILICAL  HERNIAS  AND PLACEMENT OF PAIN PUMP;  Surgeon: Adin Hector, MD;  Location: [facility];  Service: General;  Laterality: N/A;     OB History   No obstetric history on file.      Home Medications    Prior to Admission medications   Medication Sig Start Date End Date Taking? Authorizing Provider  esomeprazole (NEXIUM) 40 MG capsule Take 40 mg by mouth daily before breakfast.    [provider]  labetalol  (NORMODYNE) 100 MG tablet Take 100 mg by mouth 2 (two) times daily.    [provider]  naproxen (NAPROSYN) 500 MG tablet Take 1 tablet (500 mg total) by mouth 2 (two) times daily with a meal. 02/24/13   Michael Boston, MD  nystatin-triamcinolone ointment [facility]) Apply topically 2 (two) times daily.    [provider]  ranitidine (ZANTAC) 150 MG tablet Take 150 mg by mouth 2 (two) times daily.    [provider]    Family History Family History  Problem Relation Age of Onset  . Diabetes Mother   . Hypertension Mother     Social History Social History   Tobacco Use  . Smoking status: Never Smoker  . Smokeless tobacco: Never Used  Substance Use Topics  . Alcohol use: Yes    Comment: very rare  . Drug use: No     Allergies   Patient has no known allergies.   Review of Systems Review of Systems  Musculoskeletal: Positive for arthralgias.  Allergic/Immunologic: Negative for immunocompromised state.  Neurological: Negative for weakness and numbness.     Physical Exam Updated Vital Signs BP (!) 152/97 (BP Location: Right Arm)   Pulse 65   Temp 98.2 F (36.8 C) (Oral)   Resp 16   Ht 4\' 11"  (1.499 m)   Wt 92.5 kg   SpO2 100%  BMI 41.20 kg/m   Physical Exam Vitals signs and nursing note reviewed.  Constitutional:      Appearance: She is well-developed. She is not ill-appearing or toxic-appearing.  HENT:     Head: Normocephalic and atraumatic.     Nose: Nose normal.  Eyes:     General: No scleral icterus.       Right eye: No discharge.        Left eye: No discharge.     Conjunctiva/sclera: Conjunctivae normal.  Neck:     Musculoskeletal: Normal range of motion.     Vascular: No JVD.  Cardiovascular:     Rate and Rhythm: Normal rate and regular rhythm.     Pulses: Normal pulses.     Heart sounds: Normal heart sounds.  Pulmonary:     Effort: Pulmonary effort is normal.     Breath sounds: Normal breath sounds.  Abdominal:      General: There is no distension.  Musculoskeletal: Normal range of motion.     Comments: Left hand with tenderness to palpation of to base of left thumb extending into dorsum with mild swelling and erythema. 1 cm superficial linear skin tear. Full ROM without pain. Normal sensation and motor function in the median, ulnar, and radial nerve distributions. 2+ radial pulse. Patient can touch the thumb to each one of the fingertips without difficulty.    Skin:    General: Skin is warm.  Neurological:     Mental Status: She is oriented to person, place, and time.     GCS: GCS eye subscore is 4. GCS verbal subscore is 5. GCS motor subscore is 6.     Comments: Fluent speech, no facial droop.  Psychiatric:        Behavior: Behavior normal.      ED Treatments / Results  Labs (all labs ordered are listed, but only abnormal results are displayed) Labs Reviewed - No data to display  EKG None  Radiology No results found.  Procedures Procedures (including critical care time)  Medications Ordered in ED Medications  bacitracin ointment (has no administration in time range)     Initial Impression / Assessment and Plan / ED Course  I have reviewed the triage vital signs and the nursing notes.  Pertinent labs & imaging results that were available during my care of the patient were reviewed by me and considered in my medical decision making (see chart for details).   Pt is well appearing, in no acute distress. Exam consistent with 1st degree burn to dorsum of left hand. Full ROM of left wrist, wiggles all fingers. Doubt need for further emergent work up at this time. I explained the diagnosis and have given explicit precautions to return to the ER including for any other new or worsening symptoms. The patient understands and accepts the medical plan as it's been dictated and I have answered their questions. Discharge instructions concerning home care and prescriptions have been given. The  patient is stable and is discharged to home in good condition.  This note was prepared using Dragon voice recognition software and may include unintentional dictation errors due to the inherent limitations of voice recognition software.   Final Clinical Impressions(s) / ED Diagnoses   Final diagnoses:  Burn    ED Discharge Orders    None       Flint Melter 01/06/19 2119    Isla Pence, MD 01/06/19 2141

## 2019-02-09 MED FILL — PANTOPRAZOLE SOD DR 40 MG T: 40 | 90 days supply | Qty: 90 | Fill #2

## 2019-03-16 MED FILL — AMLODIPINE BESYLATE 5 MG TA: 5 | 90 days supply | Qty: 90 | Fill #2

## 2019-03-16 MED FILL — METOPROLOL SUCCINATE ER 50: 50 | 90 days supply | Qty: 90 | Fill #2

## 2019-04-05 DIAGNOSIS — G4733 Obstructive sleep apnea (adult) (pediatric): Secondary | ICD-10-CM | POA: Diagnosis not present

## 2019-05-05 DIAGNOSIS — G4733 Obstructive sleep apnea (adult) (pediatric): Secondary | ICD-10-CM | POA: Diagnosis not present

## 2019-05-15 MED FILL — PANTOPRAZOLE SOD DR 40 MG T: 40 | 90 days supply | Qty: 90 | Fill #3

## 2019-06-05 DIAGNOSIS — G4733 Obstructive sleep apnea (adult) (pediatric): Secondary | ICD-10-CM | POA: Diagnosis not present

## 2019-06-21 IMAGING — MG DIGITAL SCREENING BILATERAL MAMMOGRAM WITH TOMO AND CAD
8 series · 8 of 24 positions shown · non-contrast
Comparison: Previous exam(s).

CLINICAL DATA: Screening.

EXAM:
DIGITAL SCREENING BILATERAL MAMMOGRAM WITH TOMO AND CAD

[L MLO synth-2D]
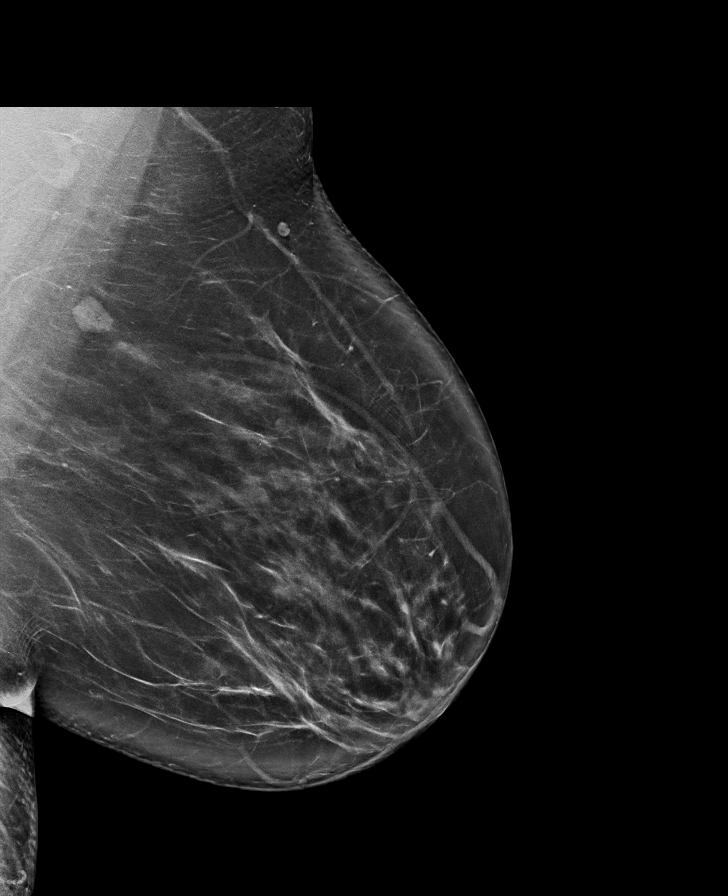

[L CC synth-2D]
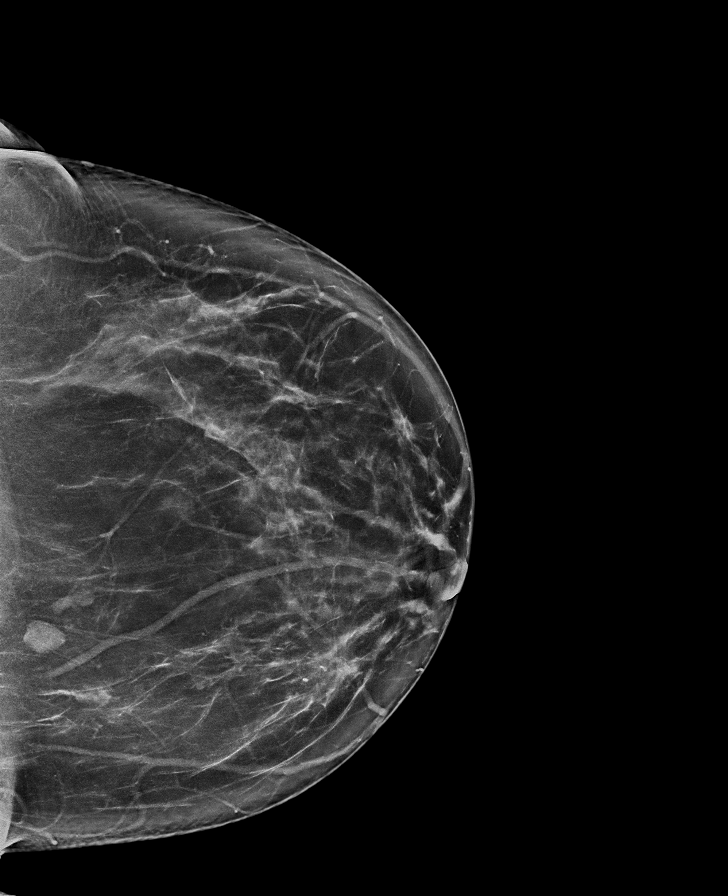

[R MLO synth-2D]
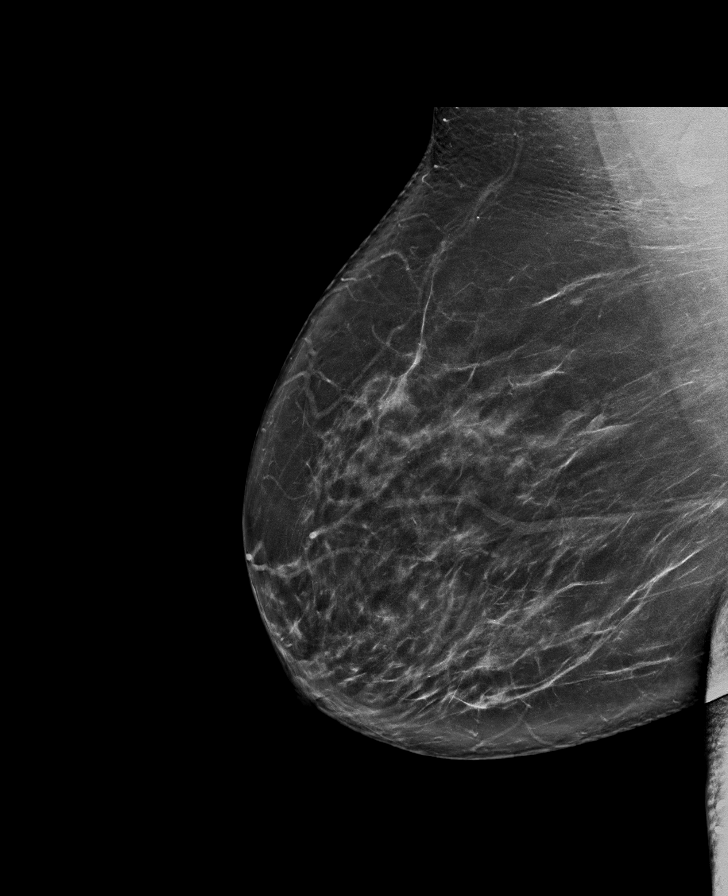

[R CC synth-2D]
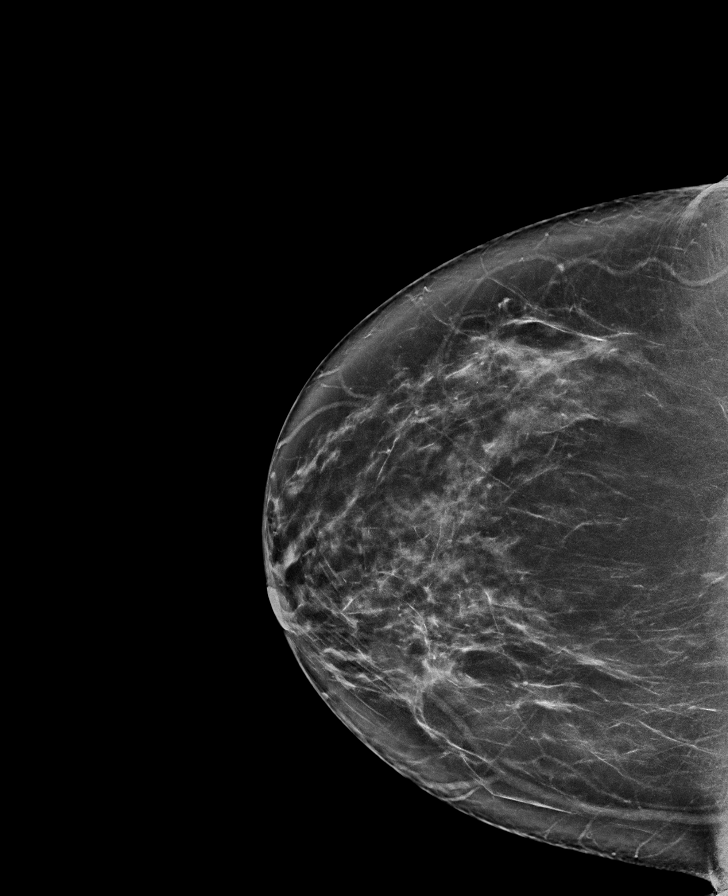

[R MLO tomo · tomo slice 46/91.0]
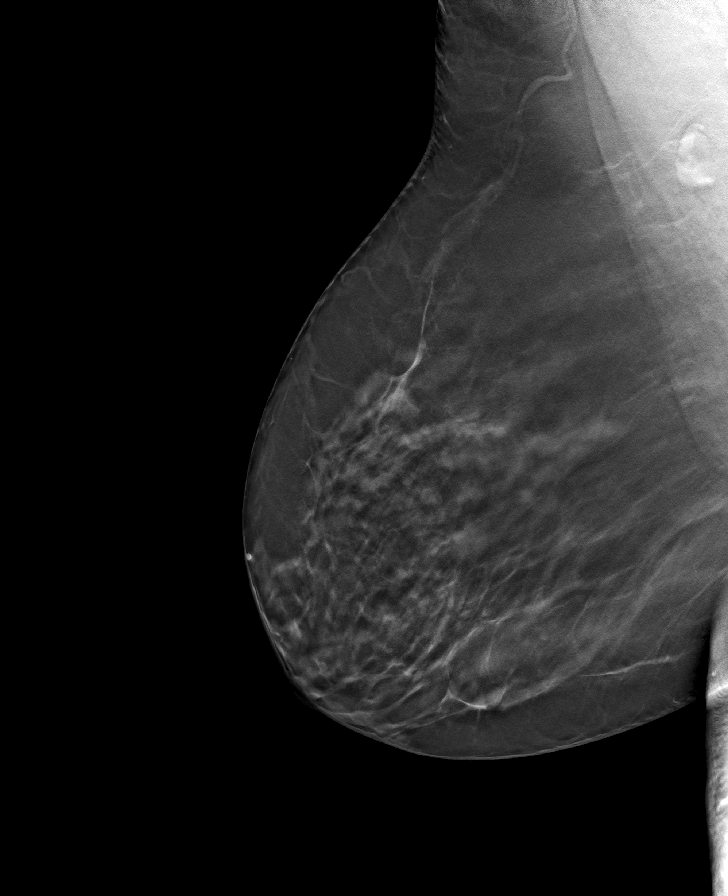

[L MLO tomo · tomo slice 49/96.0]
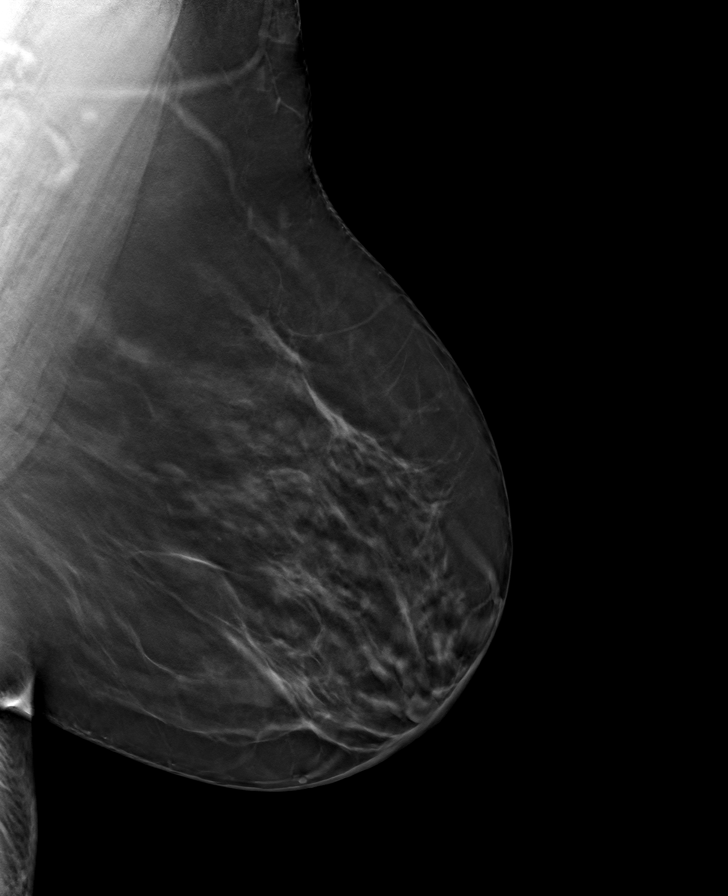

[L CC tomo · tomo slice 43/84.0]
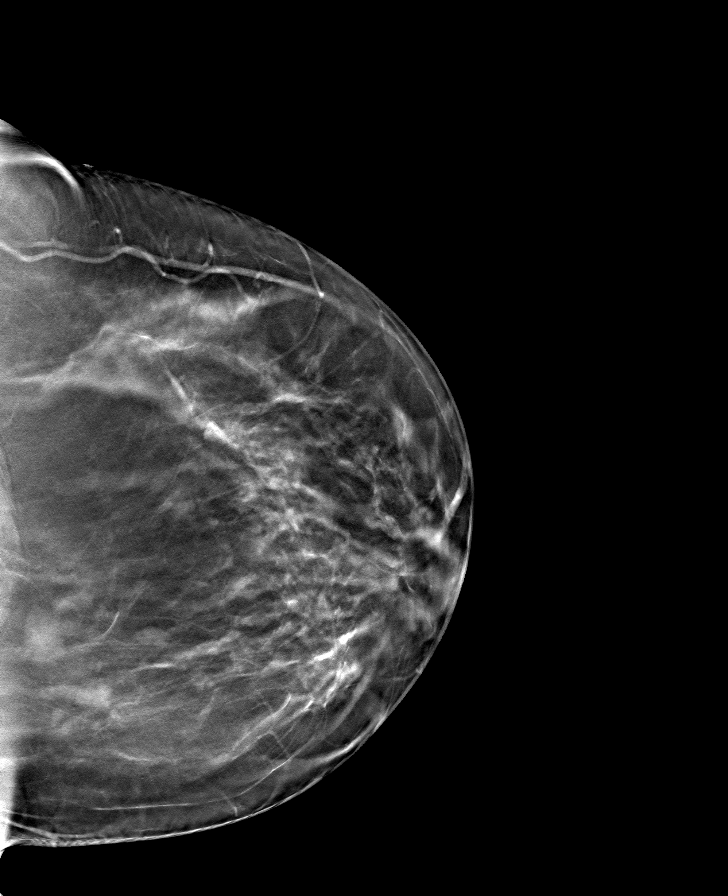

[R CC tomo · tomo slice 43/86.0]
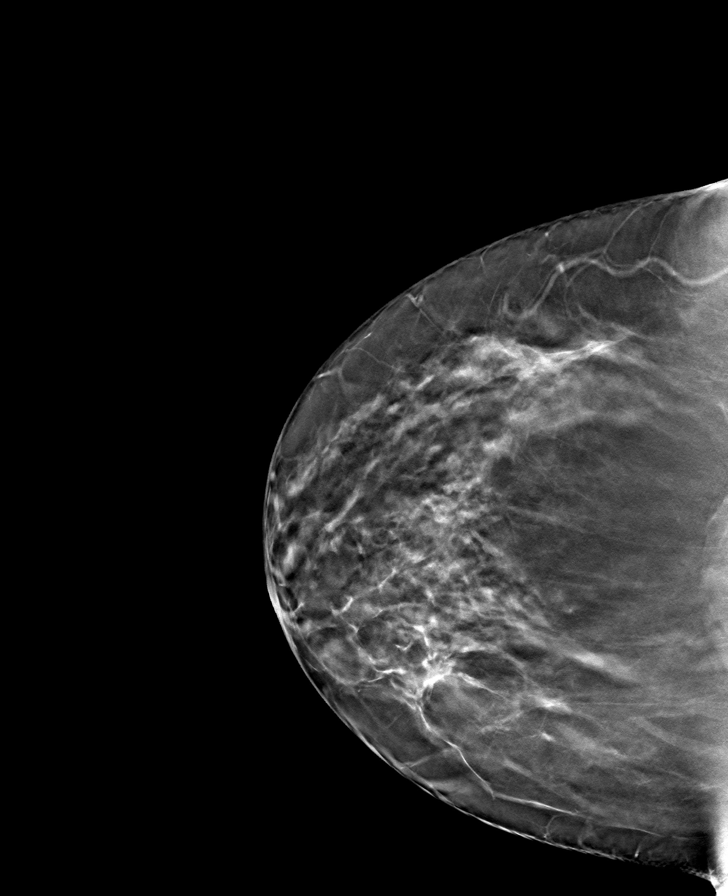

[8 of 24 positions shown; findings below may reference images not displayed]

ACR Breast Density Category b: There are scattered areas of
fibroglandular density.
FINDINGS: There are no findings suspicious for malignancy. Images were
processed with CAD.
IMPRESSION: No mammographic evidence of malignancy. A result letter of this
screening mammogram will be mailed directly to the patient.

RECOMMENDATION:
Screening mammogram in one year. (Code:[id number])

BI-RADS CATEGORY  1: Negative.

## 2019-06-23 MED FILL — AMLODIPINE BESYLATE 5 MG TA: 5 | 90 days supply | Qty: 90 | Fill #3

## 2019-06-23 MED FILL — METOPROLOL SUCCINATE ER 50: 50 | 90 days supply | Qty: 90 | Fill #3

## 2019-08-07 DIAGNOSIS — G4733 Obstructive sleep apnea (adult) (pediatric): Secondary | ICD-10-CM | POA: Diagnosis not present

## 2019-08-19 MED FILL — PANTOPRAZOLE SOD DR 40 MG T: 40 | 90 days supply | Qty: 90 | Fill #0

## 2019-08-21 DIAGNOSIS — H524 Presbyopia: Secondary | ICD-10-CM | POA: Diagnosis not present

## 2019-09-07 DIAGNOSIS — Z01419 Encounter for gynecological examination (general) (routine) without abnormal findings: Secondary | ICD-10-CM | POA: Diagnosis not present

## 2019-09-07 DIAGNOSIS — B354 Tinea corporis: Secondary | ICD-10-CM | POA: Diagnosis not present

## 2019-09-07 DIAGNOSIS — G4733 Obstructive sleep apnea (adult) (pediatric): Secondary | ICD-10-CM | POA: Diagnosis not present

## 2019-09-07 MED FILL — NYSTATIN-TRIAMCINOLONE OINT: 100000-0.1 | 30 days supply | Qty: 30 | Fill #0

## 2019-09-08 DIAGNOSIS — R739 Hyperglycemia, unspecified: Secondary | ICD-10-CM | POA: Diagnosis not present

## 2019-09-08 DIAGNOSIS — Z0000 Encounter for general adult medical examination without abnormal findings: Secondary | ICD-10-CM | POA: Diagnosis not present

## 2019-09-08 DIAGNOSIS — K219 Gastro-esophageal reflux disease without esophagitis: Secondary | ICD-10-CM | POA: Diagnosis not present

## 2019-09-08 DIAGNOSIS — E669 Obesity, unspecified: Secondary | ICD-10-CM | POA: Diagnosis not present

## 2019-09-08 DIAGNOSIS — I10 Essential (primary) hypertension: Secondary | ICD-10-CM | POA: Diagnosis not present

## 2019-09-08 DIAGNOSIS — G4733 Obstructive sleep apnea (adult) (pediatric): Secondary | ICD-10-CM | POA: Diagnosis not present

## 2019-09-08 DIAGNOSIS — R748 Abnormal levels of other serum enzymes: Secondary | ICD-10-CM | POA: Diagnosis not present

## 2019-09-10 DIAGNOSIS — R739 Hyperglycemia, unspecified: Secondary | ICD-10-CM | POA: Diagnosis not present

## 2019-09-10 DIAGNOSIS — Z0000 Encounter for general adult medical examination without abnormal findings: Secondary | ICD-10-CM | POA: Diagnosis not present

## 2019-09-10 DIAGNOSIS — I10 Essential (primary) hypertension: Secondary | ICD-10-CM | POA: Diagnosis not present

## 2019-09-10 DIAGNOSIS — E1169 Type 2 diabetes mellitus with other specified complication: Secondary | ICD-10-CM | POA: Diagnosis not present

## 2019-09-10 DIAGNOSIS — Z13220 Encounter for screening for lipoid disorders: Secondary | ICD-10-CM | POA: Diagnosis not present

## 2019-09-14 MED FILL — metFORMIN HCL 500 MG TABS: 500 | 30 days supply | Qty: 60 | Fill #0

## 2019-09-15 MED FILL — FREESTYLE LITE TEST STRIP: 90 days supply | Qty: 100 | Fill #0

## 2019-09-15 MED FILL — FREESTYLE LANCETS: 90 days supply | Qty: 100 | Fill #0

## 2019-09-23 MED FILL — FREESTYLE LITE METER: 1 days supply | Qty: 1 | Fill #0

## 2019-09-28 ENCOUNTER — Other Ambulatory Visit: Payer: Self-pay | Admitting: Family Medicine

## 2019-09-28 DIAGNOSIS — Z1231 Encounter for screening mammogram for malignant neoplasm of breast: Secondary | ICD-10-CM

## 2019-09-29 ENCOUNTER — Other Ambulatory Visit: Payer: Self-pay

## 2019-09-29 ENCOUNTER — Ambulatory Visit
Admission: RE | Admit: 2019-09-29 | Discharge: 2019-09-29 | Disposition: A | Discharge status: Home / Self Care | Payer: 59 | Source: Ambulatory Visit

## 2019-09-29 DIAGNOSIS — G4733 Obstructive sleep apnea (adult) (pediatric): Secondary | ICD-10-CM | POA: Diagnosis not present

## 2019-09-29 DIAGNOSIS — I10 Essential (primary) hypertension: Secondary | ICD-10-CM | POA: Diagnosis not present

## 2019-09-29 DIAGNOSIS — K219 Gastro-esophageal reflux disease without esophagitis: Secondary | ICD-10-CM | POA: Diagnosis not present

## 2019-09-29 DIAGNOSIS — E1169 Type 2 diabetes mellitus with other specified complication: Secondary | ICD-10-CM | POA: Diagnosis not present

## 2019-09-29 DIAGNOSIS — Z1231 Encounter for screening mammogram for malignant neoplasm of breast: Secondary | ICD-10-CM | POA: Diagnosis not present

## 2019-10-02 MED FILL — METOPROLOL SUCCINATE ER 50: 50 | 90 days supply | Qty: 90 | Fill #0

## 2019-10-02 MED FILL — AMLODIPINE BESYLATE 5 MG TA: 5 | 90 days supply | Qty: 90 | Fill #0

## 2019-10-05 DIAGNOSIS — G4733 Obstructive sleep apnea (adult) (pediatric): Secondary | ICD-10-CM | POA: Diagnosis not present

## 2019-10-14 MED FILL — METFORMIN HCL 500 MG TABS: 500 | 30 days supply | Qty: 60 | Fill #1

## 2019-10-23 ENCOUNTER — Encounter: Payer: 59 | Attending: Family Medicine | Admitting: Registered"

## 2019-10-23 ENCOUNTER — Encounter: Payer: Self-pay | Admitting: Registered"

## 2019-10-23 ENCOUNTER — Other Ambulatory Visit: Payer: Self-pay

## 2019-10-23 DIAGNOSIS — E119 Type 2 diabetes mellitus without complications: Secondary | ICD-10-CM | POA: Insufficient documentation

## 2019-10-23 NOTE — Progress Notes (Signed)
Diabetes Self-Management Education  Visit Type: First/Initial  Appt. Start Time: 0800 Appt. End Time: 0930  10/23/2019  [name], identified by name and date of birth, is a 54 y.o. female with a diagnosis of Diabetes: Type 2.   ASSESSMENT  There were no vitals taken for this visit. There is no height or weight on file to calculate BMI.  Patient states she has been helping her husband and mother take care of their diabetes but was not paying attention to what might be happening to her health. Pt states she was not aware how much stress she was under until recently and needed to pay more attention to taking care of herself.  Patient states she is worried about her mothers physical and mental health due to being isolated from family due to living in a nursing facility.  Patient states awhile ago had started an exercise routine but was not in a strong habit before life got busy with her daughter's wedding and move to [place]. Patient states she is willing to continue an exercise routine even if she does not see weight loss and realizes there are many benefits to exercise outside of what the scale can measure.  RD also discussed morning blood sugar and strategies. May go into more depth next time depending on what her blood sugar log reveals.  RD plans to focus more on identify and calculating carbs as well as more on mindful eating at next visit.  Diabetes Self-Management Education - 10/23/19 0829      Visit Information   Visit Type  First/Initial      Initial Visit   Diabetes Type  Type 2    Are you currently following a meal plan?  No    Are you taking your medications as prescribed?  Yes    Date Diagnosed  last month      Health Coping   How would you rate your overall health?  Good      Psychosocial Assessment   Patient Belief/Attitude about Diabetes  Motivated to manage diabetes    How often do you need to have someone help you when you read instructions, pamphlets,  or other written materials from your doctor or pharmacy?  1 - Never    What is the last grade level you completed in school?  4 yrs college      Complications   Last HgB A1C per patient/outside source  14.7 %    How often do you check your blood sugar?  1-2 times/day    Fasting Blood glucose range (mg/dL)  130-179   150-220   Number of hypoglycemic episodes per month  0    Number of hyperglycemic episodes per week  0    Have you had a dilated eye exam in the past 12 months?  No   has appointment tomorrow   Have you had a dental exam in the past 12 months?  Yes    Are you checking your feet?  No      Dietary Intake   Breakfast  1 boiled eggs, 3 strips bacon OR flavored oatmeal with nuts, cranberries,    Snack (morning)  nuts OR popcorn    Lunch  left overs OR chiken black beans, salsa, lettuce, olives    Dinner  meat fish 1-3x week, greens, cucumbers OR salads, [employer] lemon garlic dressing OR pasta, sauce, shrimp, sausage    Snack (evening)  1 microwave bag of popcorn (my kriptonite) OR apples OR salsa with  corn chips    Beverage(s)  coffee, flavored creamer, 2 tsp sugar      Exercise   Exercise Type  ADL's    How many days per week to you exercise?  0    How many minutes per day do you exercise?  0    Total minutes per week of exercise  0      Patient Education   Previous Diabetes Education  No    Disease state   Definition of diabetes, type 1 and 2, and the diagnosis of diabetes    Physical activity and exercise   Role of exercise on diabetes management, blood pressure control and cardiac health.    Medications  Reviewed patients medication for diabetes, action, purpose, timing of dose and side effects.    Monitoring  Taught/evaluated SMBG meter.;Identified appropriate SMBG and/or A1C goals.    Chronic complications  Retinopathy and reason for yearly dilated eye exams;Assessed and discussed foot care and prevention of foot problems;Dental care    Psychosocial adjustment  Role of  stress on diabetes      Individualized Goals (developed by patient)   Physical Activity  Exercise 3-5 times per week    Monitoring   test my blood glucose as discussed      Outcomes   Expected Outcomes  Demonstrated interest in learning. Expect positive outcomes    Future DMSE  4-6 wks    Program Status  Not Completed       Individualized Plan for Diabetes Self-Management Training:   Learning Objective:  Patient will have a greater understanding of diabetes self-management. Patient education plan is to attend individual and/or group sessions per assessed needs and concerns.   Plan:   Patient Instructions  You can get the accu chec fast clix lancing device and drums over the counter. If you keep using the individual lancets, remember to store used lancets in a hard plastic container and seal before discarding.  Continue checking your fasting blood sugar and you can also start checking 2 hours after some meals to see how your body handles the carbs in them and start making some adjustments.   Start working back into an exercise routine.  Continue to be aware of your self-care needs.   Expected Outcomes:  Demonstrated interest in learning. Expect positive outcomes  Education material provided: ADA - How to Thrive: A Guide for Your Journey with Diabetes and Snack sheet  If problems or questions, patient to contact team via:  Phone and MyChart  Future DSME appointment: 4-6 wks

## 2019-10-23 NOTE — Patient Instructions (Addendum)
You can get the accu chec fast clix lancing device and drums over the counter. If you keep using the individual lancets, remember to store used lancets in a hard plastic container and seal before discarding.  Continue checking your fasting blood sugar and you can also start checking 2 hours after some meals to see how your body handles the carbs in them and start making some adjustments.   Start working back into an exercise routine.  Continue to be aware of your self-care needs.

## 2019-10-24 LAB — HM DIABETES EYE EXAM

## 2019-11-04 DIAGNOSIS — G4733 Obstructive sleep apnea (adult) (pediatric): Secondary | ICD-10-CM | POA: Diagnosis not present

## 2019-11-12 MED FILL — METFORMIN HCL 500 MG TABS: 500 | 30 days supply | Qty: 60 | Fill #2

## 2019-11-12 MED FILL — PANTOPRAZOLE SOD DR 40 MG T: 40 | 90 days supply | Qty: 90 | Fill #1

## 2019-11-25 DIAGNOSIS — E785 Hyperlipidemia, unspecified: Secondary | ICD-10-CM | POA: Diagnosis not present

## 2019-11-25 DIAGNOSIS — R748 Abnormal levels of other serum enzymes: Secondary | ICD-10-CM | POA: Diagnosis not present

## 2019-11-25 DIAGNOSIS — E669 Obesity, unspecified: Secondary | ICD-10-CM | POA: Diagnosis not present

## 2019-11-25 DIAGNOSIS — G4733 Obstructive sleep apnea (adult) (pediatric): Secondary | ICD-10-CM | POA: Diagnosis not present

## 2019-11-25 DIAGNOSIS — I10 Essential (primary) hypertension: Secondary | ICD-10-CM | POA: Diagnosis not present

## 2019-11-25 DIAGNOSIS — E1169 Type 2 diabetes mellitus with other specified complication: Secondary | ICD-10-CM | POA: Diagnosis not present

## 2019-11-25 DIAGNOSIS — Z23 Encounter for immunization: Secondary | ICD-10-CM | POA: Diagnosis not present

## 2019-12-04 ENCOUNTER — Other Ambulatory Visit: Payer: Self-pay

## 2019-12-04 ENCOUNTER — Encounter: Payer: 59 | Attending: Family Medicine | Admitting: Registered"

## 2019-12-04 ENCOUNTER — Encounter: Payer: Self-pay | Admitting: Registered"

## 2019-12-04 DIAGNOSIS — E119 Type 2 diabetes mellitus without complications: Secondary | ICD-10-CM | POA: Insufficient documentation

## 2019-12-04 NOTE — Progress Notes (Signed)
Diabetes Self-Management Education  Visit Type: Follow-up  Appt. Start Time: 0800 Appt. End Time: 0830  12/04/2019  [name], identified by name and date of birth, is a 54 y.o. female with a diagnosis of Diabetes: Type 2.   ASSESSMENT  There were no vitals taken for this visit. There is no height or weight on file to calculate BMI.   Patient continues to have goal of not taking any diabetes medications. RD explained what dietary and lifestyle changes would be necessary for that to be a possibility.  Patient states she has not been able to get started on an exercise routine.   Pt reports she is confused by the high blood sugar in the morning but will be lower in the evening, provided example of one day FBS 193 mg/dL, pre-dinner 173 mg/dL  RD taught in-depth diabetes pathophysiology and how to evaluate blood sugar trends. RD presented options for diabetes management. Pt could discuss increasing metformin dose with MD or make some significant changes to diet and lifestyles. No goals were set today, but patient will consider options discussed and return in 1 month.  Diabetes Self-Management Education - 12/04/19 1344      Visit Information   Visit Type  Follow-up      Initial Visit   Diabetes Type  Type 2      Complications   How often do you check your blood sugar?  1-2 times/day      Dietary Intake   Breakfast  bacon, egg, cheese biscuit    Snack (morning)  nuts and cranberries    Lunch  left overs    Snack (afternoon)  grapes, cheese    Dinner  fish, vegetables    Beverage(s)  water, coffee      Patient Education   Disease state   Other (comment)   patterns of glucose and insulin during the day vs night   Physical activity and exercise   Role of exercise on diabetes management, blood pressure control and cardiac health.      Patient Self-Evaluation of Goals - Patient rates self as meeting previously set goals (% of time)   Physical Activity  25 - 50%    Monitoring   25 - 50%      Outcomes   Expected Outcomes  Demonstrated interest in learning. Expect positive outcomes    Future DMSE  4-6 wks    Program Status  Not Completed      Subsequent Visit   Since your last visit have you continued or begun to take your medications as prescribed?  Yes   1000 mg with dinner      Individualized Plan for Diabetes Self-Management Training:   Learning Objective:  Patient will have a greater understanding of diabetes self-management. Patient education plan is to attend individual and/or group sessions per assessed needs and concerns.   Plan:   Patient Instructions  Things to consider to bring down blood sugar: Increase exercise Eat vegetables for snacks instead of fruit  Consider checking blood sugar more frequently to understand how food is affecting your blood sugar.   Expected Outcomes:  Demonstrated interest in learning. Expect positive outcomes  Education material provided: hand-drawn graphs to explain hypothetical daily blood glucose patterns as well as the different effects of carb vs protein vs fat on blood sugar.  If problems or questions, patient to contact team via:  Phone and MyChart Future DSME appointment: 4-6 wks

## 2019-12-04 NOTE — Patient Instructions (Signed)
Things to consider to bring down blood sugar: Increase exercise Eat vegetables for snacks instead of fruit  Consider checking blood sugar more frequently to understand how food is affecting your blood sugar.

## 2019-12-05 DIAGNOSIS — G4733 Obstructive sleep apnea (adult) (pediatric): Secondary | ICD-10-CM | POA: Diagnosis not present

## 2019-12-25 MED FILL — METOPROLOL SUCCINATE ER 50: 50 | 90 days supply | Qty: 90 | Fill #1

## 2019-12-25 MED FILL — AMLODIPINE BESYLATE 5 MG TA: 5 | 90 days supply | Qty: 90 | Fill #1

## 2020-01-05 DIAGNOSIS — G4733 Obstructive sleep apnea (adult) (pediatric): Secondary | ICD-10-CM | POA: Diagnosis not present

## 2020-01-06 DIAGNOSIS — E1169 Type 2 diabetes mellitus with other specified complication: Secondary | ICD-10-CM | POA: Diagnosis not present

## 2020-01-12 MED FILL — metFORMIN HCL ER 500 MG TB2: 500 | 30 days supply | Qty: 60 | Fill #0

## 2020-01-15 ENCOUNTER — Encounter: Payer: 59 | Attending: Family Medicine | Admitting: Registered"

## 2020-01-15 ENCOUNTER — Encounter: Payer: Self-pay | Admitting: Registered"

## 2020-01-15 ENCOUNTER — Other Ambulatory Visit: Payer: Self-pay

## 2020-01-15 DIAGNOSIS — E119 Type 2 diabetes mellitus without complications: Secondary | ICD-10-CM | POA: Insufficient documentation

## 2020-01-15 NOTE — Progress Notes (Signed)
[employer] Employee visit 3 of 3  Diabetes Self-Management Education  Visit Type: Follow-up  Appt. Start Time: 0800 Appt. End Time: 0830  01/15/2020  [name], identified by name and date of birth, is a 54 y.o. female with a diagnosis of Diabetes: Type 2.   ASSESSMENT  There were no vitals taken for this visit. There is no height or weight on file to calculate BMI.   Pt reports most recent A1c is 9.7%, down from 14.7% at first visit.  Patient states changes made since last visit: * increased vegetable intake and decreased fruits * no sugar in coffee, only cream * increased physical activity * started drinking more water, now ~48 oz plus coffee  Patient states since she has been eating more intentionally she has had hunger cues such as headache and just a feeling that she needs to eat and instead of ignoring and pushing through to finish what she is doing, she will take her lunch break and eat.  Patient has started checking 2 hrs post meal blood sugar a few times per week, readings for last week range 151-186 mg/dL. FBS continues to be elevated at 184 mg/dL but is trending down.  Pt states she is getting a new Rx for metformin XR due to GI issues from standard formula.   Diabetes Self-Management Education - 01/15/20 1000      Visit Information   Visit Type Follow-up      Initial Visit   Diabetes Type Type 2      Complications   Last HgB A1C per patient/outside source 9.7 %    How often do you check your blood sugar? 1-2 times/day    Fasting Blood glucose range (mg/dL) 130-179;180-200   164-254, mostly 184   Postprandial Blood glucose range (mg/dL) 130-179;180-200   151-186     Exercise   Exercise Type Light (walking / raking leaves)    How many days per week to you exercise? 7    How many minutes per day do you exercise? --   7-8,000 steps     Patient Education   Disease state  Other (comment)   liver role and insulin resistance in FBS   Physical activity  and exercise  Role of exercise on diabetes management, blood pressure control and cardiac health.      Individualized Goals (developed by patient)   Nutrition General guidelines for healthy choices and portions discussed    Physical Activity Exercise 5-7 days per week    Monitoring  Other (comment)   get new Rx to check more often     Patient Self-Evaluation of Goals - Patient rates self as meeting previously set goals (% of time)   Nutrition >75%    Physical Activity >75%    Monitoring 50 - 75 %      Outcomes   Expected Outcomes Demonstrated interest in learning. Expect positive outcomes    Future DMSE PRN    Program Status Completed      Subsequent Visit   Since your last visit have you continued or begun to take your medications as prescribed? Yes   MD changing metformin to extended release          Individualized Plan for Diabetes Self-Management Training:   Learning Objective:  Patient will have a greater understanding of diabetes self-management. Patient education plan is to attend individual and/or group sessions per assessed needs and concerns.   Patient Instructions  You are making good progress on blood sugar  control! Continue with your goal to increase water intake Continue paying attention to your hunger cues Consider increasing your physical activity to help with improving insulin sensitivity.   Expected Outcomes:  Demonstrated interest in learning. Expect positive outcomes  Education material provided: none  If problems or questions, patient to contact team via:  Phone and MyChart  Future DSME appointment: PRN

## 2020-01-15 NOTE — Patient Instructions (Signed)
You are making good progress on blood sugar control! Continue with your goal to increase water intake Continue paying attention to your hunger cues Consider increasing your physical activity to help with improving insulin sensitivity.

## 2020-02-03 MED FILL — PANTOPRAZOLE SOD DR 40 MG T: 40 | 90 days supply | Qty: 90 | Fill #2

## 2020-02-04 DIAGNOSIS — G4733 Obstructive sleep apnea (adult) (pediatric): Secondary | ICD-10-CM | POA: Diagnosis not present

## 2020-02-11 MED FILL — metFORMIN HCL ER 500 MG TB2: 500 | 30 days supply | Qty: 60 | Fill #1

## 2020-03-06 DIAGNOSIS — G4733 Obstructive sleep apnea (adult) (pediatric): Secondary | ICD-10-CM | POA: Diagnosis not present

## 2020-03-14 MED FILL — metFORMIN HCL ER 500 MG TB2: 500 | 30 days supply | Qty: 60 | Fill #2

## 2020-03-14 MED FILL — FREESTYLE LANCETS: 90 days supply | Qty: 100 | Fill #2

## 2020-03-14 MED FILL — FREESTYLE LITE TEST STRIP: 90 days supply | Qty: 100 | Fill #2

## 2020-03-30 ENCOUNTER — Other Ambulatory Visit (HOSPITAL_COMMUNITY): Payer: Self-pay | Admitting: Family Medicine

## 2020-03-30 MED FILL — AMLODIPINE BESYLATE 5 MG TA: 5 | 90 days supply | Qty: 90 | Fill #0

## 2020-03-30 MED FILL — METOPROLOL SUCCINATE ER 50: 50 | 90 days supply | Qty: 90 | Fill #0

## 2020-04-11 ENCOUNTER — Ambulatory Visit: Payer: 59 | Admitting: Registered"

## 2020-04-18 MED FILL — metFORMIN HCL ER 500 MG TB2: 500 | 30 days supply | Qty: 60 | Fill #3

## 2020-05-04 MED FILL — PANTOPRAZOLE SOD DR 40 MG T: 40 | 90 days supply | Qty: 90 | Fill #3

## 2020-05-19 MED FILL — metFORMIN HCL ER 500 MG TB2: 500 | 90 days supply | Qty: 180 | Fill #0

## 2020-06-09 MED FILL — FREESTYLE LANCETS: 90 days supply | Qty: 100 | Fill #3

## 2020-06-09 MED FILL — FREESTYLE LITE TEST STRIP: 90 days supply | Qty: 100 | Fill #3

## 2020-06-17 DIAGNOSIS — G4733 Obstructive sleep apnea (adult) (pediatric): Secondary | ICD-10-CM | POA: Diagnosis not present

## 2020-06-22 ENCOUNTER — Encounter: Payer: Self-pay | Admitting: Family Medicine

## 2020-06-22 ENCOUNTER — Other Ambulatory Visit: Payer: Self-pay

## 2020-06-22 ENCOUNTER — Ambulatory Visit (INDEPENDENT_AMBULATORY_CARE_PROVIDER_SITE_OTHER): Payer: 59 | Admitting: Family Medicine

## 2020-06-22 VITALS — BP 126/80 | HR 92 | Temp 98.3°F | Ht 59.25 in | Wt 195.0 lb

## 2020-06-22 DIAGNOSIS — E559 Vitamin D deficiency, unspecified: Secondary | ICD-10-CM | POA: Diagnosis not present

## 2020-06-22 DIAGNOSIS — Z7689 Persons encountering health services in other specified circumstances: Secondary | ICD-10-CM | POA: Diagnosis not present

## 2020-06-22 DIAGNOSIS — E669 Obesity, unspecified: Secondary | ICD-10-CM | POA: Diagnosis not present

## 2020-06-22 DIAGNOSIS — E1165 Type 2 diabetes mellitus with hyperglycemia: Secondary | ICD-10-CM | POA: Insufficient documentation

## 2020-06-22 LAB — COMPREHENSIVE METABOLIC PANEL
ALT: 61 U/L — ABNORMAL HIGH (ref 0–35)
AST: 31 U/L (ref 0–37)
Albumin: 4.2 g/dL (ref 3.5–5.2)
Alkaline Phosphatase: 101 U/L (ref 39–117)
BUN: 12 mg/dL (ref 6–23)
CO2: 28 mEq/L (ref 19–32)
Calcium: 9.5 mg/dL (ref 8.4–10.5)
Chloride: 100 mEq/L (ref 96–112)
Creatinine, Ser: 0.69 mg/dL (ref 0.40–1.20)
GFR: 97.29 mL/min (ref >60.00)
Glucose, Bld: 225 mg/dL — ABNORMAL HIGH (ref 70–99)
Potassium: 3.7 mEq/L (ref 3.5–5.1)
Sodium: 137 mEq/L (ref 135–145)
Total Bilirubin: 0.5 mg/dL (ref 0.2–1.2)
Total Protein: 8.4 g/dL — ABNORMAL HIGH (ref 6.0–8.3)

## 2020-06-22 LAB — HEMOGLOBIN A1C: Hgb A1c MFr Bld: 10.7 % — ABNORMAL HIGH (ref 4.6–6.5)

## 2020-06-22 LAB — LIPID PANEL
Cholesterol: 192 mg/dL (ref 0–200)
HDL: 44.5 mg/dL (ref >39.00)
LDL Cholesterol: 117 mg/dL — ABNORMAL HIGH (ref 0–99)
NonHDL: 147.78
Total CHOL/HDL Ratio: 4
Triglycerides: 152 mg/dL — ABNORMAL HIGH (ref 0.0–149.0)
VLDL: 30.4 mg/dL (ref 0.0–40.0)

## 2020-06-22 LAB — MICROALBUMIN / CREATININE URINE RATIO
Creatinine,U: 159.5 mg/dL
Microalb Creat Ratio: 6.6 mg/g (ref 0.0–30.0)
Microalb, Ur: 10.5 mg/dL — ABNORMAL HIGH (ref 0.0–1.9)

## 2020-06-22 LAB — VITAMIN D 25 HYDROXY (VIT D DEFICIENCY, FRACTURES): VITD: 17.87 ng/mL — ABNORMAL LOW (ref 30.00–100.00)

## 2020-06-22 NOTE — Patient Instructions (Signed)
   Eat in this order- veggies, protein, carb (if you are having), Glucose Goddess  There is not one right eating plan for everyone.  It may take trial and error to find what will work for you.  It is important to get adequate protein and fiber with your meals.  It is okay to not eat breakfast or to skip meals if you are not hungry.  Avoid snacking between meals.  Unless you are on a fluid restriction, drink 80 to 90 ounces of water a day.  Suggested resources- www.dietdoctor.com/diabetes/diet www.adaptyourlifeacademy.com-there is a quiz to help you determine how many carbohydrates you should eat a day  www.thefastingmethod.com  If you have diabetes or access to a blood sugar machine, I recommend you check your blood sugar daily and keep a log.  Vary the time you check your blood sugar such as fasting, before meal, 2 hours after a meal and at bedtime.  Look for trends with the foods you are eating and be a scientist of your body.  Here are some guidelines to help you with meal planning -  Avoid all processed and packaged foods (bread, pasta, crackers, chips, etc) and beverages containing calories.  Avoid added sugars and excessive natural sugars.  Pay attention to how you feel if you consume artificial sweeteners.  Do they make you more hungry or raise your blood sugar?  With every meal and snack, aim to get 20 g of protein (3 ounces of meat, 4 ounces of fish, 3 eggs, protein powder, 1 cup [place] yogurt, 1 cup cottage cheese, etc.)  Increase fiber in the form of non-starchy vegetables.  These help you feel full with very little carbohydrates and are good for gut health.  Nonstarchy vegetables include summer squash, onions, peppers, tomatoes, eggplant, broccoli, cauliflower, cabbage, lettuce, spinach.  Have small amounts of good fats such as avocado, nuts, olive oil, nut butters, olives.  Add a little cheese to your meals to make them tasty.   Try to plan your meals for the week and do some meal  preparation when able.  If possible, make lunches for the week ahead of time.  Plan a couple of dinners and make enough so you can have leftovers.  Build in a treat once a week.

## 2020-06-22 NOTE — Progress Notes (Signed)
Subjective:    Patient ID: Lauren Avila, female    DOB: 1965/11/30, 54 y.o.   MRN: [id number]  HPI Chief Complaint  Patient presents with  . Establish Care  . Diabetes    Diagnosed in February. Saw a nutritionist.  . Joint Pain and Pins and Needles   This is a 54 yo female who presents today to establish care. She  has a past medical history of Anxiety, GERD (gastroesophageal reflux disease), Hypertension, and Sleep apnea. She is the Surveyor, mining for [employer]. She lives with her husband, grown daughter and 2 grandchildren.    Last CPE- within 1-2 years, regular gyn follow up Mammo- 09/29/2019 Pap- 09/16/2018 Colonoscopy- 08/11/2015 Tdap- 02/05/2014 Flu- annual Covid 19 vaccine- fully vaccinated Eye- 2021, Dr. [facility]- regular Exercise- has tried to increase walking.   DM type 2- diagnosed in Feb, hemoglobin A1c 14.7 (scanned in media). Started on metformin xr 1,000 qd. Most recent hemoglobin A1c June/July reduced to around 9. Blood sugars range from 197-245.  Diet- breakfast- one egg, bacon or bacon/egg/ cheese on whole wheat, instant oatmeal, grapefruit, waffle. Lunch- leftover dinner (meat, vegetable, +/- starch), cucumbers, tomatoes Dinner- steak, baked potato. Avoids fried foods, drinks water, snacks- pretzels, nuts raisins, craisins, pineapple, grapes, microwave popcorn.  Most days she is hungry.    Review of Systems No visual change, no chest pain, no SOB, no abdominal pain, diarrhea or constipation, no dysuria, hematuria or frequency, no longer has menstrual cycle, no leg swelling.     Objective:   Physical Exam Physical Exam  Constitutional: Oriented to person, place, and time. Appears well-developed and well-nourished. Obese.  HENT:  Head: Normocephalic and atraumatic.  Eyes: Conjunctivae are normal.  Neck: Normal range of motion. Neck supple.  Cardiovascular: Normal rate, regular rhythm and normal heart sounds.   Pulmonary/Chest:  Effort normal and breath sounds normal.  Musculoskeletal: No lower extremity edema.   Neurological: Alert and oriented to person, place, and time.  Skin: Skin is warm and dry.  Psychiatric: Normal mood and affect. Behavior is normal. Judgment and thought content normal.  Vitals reviewed.     BP 126/80 (BP Location: Left Arm, Patient Position: Sitting, Cuff Size: Large)   Pulse 92   Temp 98.3 F (36.8 C)   Ht 4' 11.25" (1.505 m)   Wt 195 lb (88.5 kg)   SpO2 97%   BMI 39.05 kg/m  Wt Readings from Last 3 Encounters:  06/22/20 195 lb (88.5 kg)  01/06/19 204 lb (92.5 kg)  03/16/13 194 lb 12.8 oz (88.4 kg)       Assessment & Plan:  1. Encounter to establish care -Medical records in EMR and will request records from Dr. Kelton Pillar, prior PCP  2. Type 2 diabetes mellitus with hyperglycemia, without long-term current use of insulin (HCC) -Home readings not at goal, discussed diet, reducing carbs, increasing walking especially after meals - Hemoglobin A1c - Microalbumin / creatinine urine ratio - Comprehensive metabolic panel - Lipid Panel  3. Vitamin D deficiency - Vitamin D, 25-hydroxy  4. Obesity (BMI 30-39.9) -Provided written and verbal information regarding diet recommendations and resources  -Follow-up to be determined by labs  This visit occurred during the SARS-CoV-2 public health emergency.  Safety protocols were in place, including screening questions prior to the visit, additional usage of staff PPE, and extensive cleaning of exam room while observing appropriate contact time as indicated for disinfecting solutions.      Clarene Reamer, FNP-BC  [facility], [place]  06/22/2020 4:16 PM

## 2020-06-23 MED FILL — AMLODIPINE BESYLATE 5 MG TA: 5 | 90 days supply | Qty: 90 | Fill #1

## 2020-06-24 MED FILL — METOPROLOL SUCCINATE ER 50: 50 | 90 days supply | Qty: 90 | Fill #1

## 2020-06-26 ENCOUNTER — Encounter: Payer: Self-pay | Admitting: Family Medicine

## 2020-06-26 ENCOUNTER — Other Ambulatory Visit: Payer: Self-pay | Admitting: Family Medicine

## 2020-06-26 MED ORDER — VITAMIN D3 1.25 MG (50000 UT) PO TABS: 1.0000 | ORAL_TABLET | ORAL | 3 refills | Status: DC

## 2020-06-26 MED ORDER — METFORMIN HCL ER 500 MG PO TB24: 1000.0000 mg | ORAL_TABLET | Freq: Two times a day (BID) | ORAL | 0 refills | Status: DC

## 2020-06-26 MED ORDER — ATORVASTATIN CALCIUM 20 MG PO TABS: 20.0000 mg | ORAL_TABLET | Freq: Every day | ORAL | 3 refills | Status: DC

## 2020-06-26 NOTE — Addendum Note (Signed)
Addended by: Clarene Reamer B on: 06/26/2020 08:30 PM   Modules accepted: Orders

## 2020-06-27 MED FILL — VITAMIN D3 50,000 UNITS CAP: 1.25 MG | 84 days supply | Qty: 12 | Fill #0

## 2020-06-27 MED FILL — ATORVASTATIN CALCIUM 20 MG: 20 | 90 days supply | Qty: 90 | Fill #0

## 2020-07-14 ENCOUNTER — Other Ambulatory Visit: Payer: Self-pay | Admitting: Family Medicine

## 2020-07-14 DIAGNOSIS — E1165 Type 2 diabetes mellitus with hyperglycemia: Secondary | ICD-10-CM

## 2020-07-15 MED FILL — metFORMIN HCL ER 500 MG TB2: 500 | 90 days supply | Qty: 360 | Fill #0

## 2020-07-17 DIAGNOSIS — G4733 Obstructive sleep apnea (adult) (pediatric): Secondary | ICD-10-CM | POA: Diagnosis not present

## 2020-08-16 ENCOUNTER — Other Ambulatory Visit: Payer: Self-pay | Admitting: Family Medicine

## 2020-08-16 ENCOUNTER — Other Ambulatory Visit (HOSPITAL_COMMUNITY): Payer: Self-pay | Admitting: Family Medicine

## 2020-08-16 MED FILL — PANTOPRAZOLE SOD DR 40 MG T: 40 | 90 days supply | Qty: 90 | Fill #0

## 2020-08-16 NOTE — Telephone Encounter (Signed)
Last filled by historical provider  No info available Last OV with Tor Netters 06/22/20

## 2020-09-06 DIAGNOSIS — Z01419 Encounter for gynecological examination (general) (routine) without abnormal findings: Secondary | ICD-10-CM | POA: Diagnosis not present

## 2020-09-08 DIAGNOSIS — G4733 Obstructive sleep apnea (adult) (pediatric): Secondary | ICD-10-CM | POA: Diagnosis not present

## 2020-09-13 MED FILL — VITAMIN D3 50,000 UNITS CAP: 1.25 MG | 84 days supply | Qty: 12 | Fill #1

## 2020-09-26 ENCOUNTER — Other Ambulatory Visit: Payer: Self-pay | Admitting: Family Medicine

## 2020-09-26 ENCOUNTER — Ambulatory Visit (INDEPENDENT_AMBULATORY_CARE_PROVIDER_SITE_OTHER): Payer: 59 | Admitting: Family Medicine

## 2020-09-26 ENCOUNTER — Other Ambulatory Visit: Payer: Self-pay

## 2020-09-26 ENCOUNTER — Encounter: Payer: Self-pay | Admitting: Family Medicine

## 2020-09-26 VITALS — BP 142/90 | HR 62 | Temp 97.8°F | Ht 59.0 in | Wt 194.5 lb

## 2020-09-26 DIAGNOSIS — K219 Gastro-esophageal reflux disease without esophagitis: Secondary | ICD-10-CM | POA: Diagnosis not present

## 2020-09-26 DIAGNOSIS — E1165 Type 2 diabetes mellitus with hyperglycemia: Secondary | ICD-10-CM

## 2020-09-26 DIAGNOSIS — I10 Essential (primary) hypertension: Secondary | ICD-10-CM | POA: Diagnosis not present

## 2020-09-26 DIAGNOSIS — E1169 Type 2 diabetes mellitus with other specified complication: Secondary | ICD-10-CM | POA: Insufficient documentation

## 2020-09-26 DIAGNOSIS — G4733 Obstructive sleep apnea (adult) (pediatric): Secondary | ICD-10-CM | POA: Insufficient documentation

## 2020-09-26 DIAGNOSIS — R748 Abnormal levels of other serum enzymes: Secondary | ICD-10-CM | POA: Insufficient documentation

## 2020-09-26 DIAGNOSIS — Z1231 Encounter for screening mammogram for malignant neoplasm of breast: Secondary | ICD-10-CM

## 2020-09-26 DIAGNOSIS — E785 Hyperlipidemia, unspecified: Secondary | ICD-10-CM | POA: Insufficient documentation

## 2020-09-26 LAB — POCT GLYCOSYLATED HEMOGLOBIN (HGB A1C): Hemoglobin A1C: 10.1 % — AB (ref 4.0–5.6)

## 2020-09-26 MED ORDER — LISINOPRIL 10 MG PO TABS: 10.0000 mg | ORAL_TABLET | Freq: Every day | ORAL | 3 refills | Status: DC

## 2020-09-26 MED ORDER — METFORMIN HCL ER 500 MG PO TB24: 1000.0000 mg | ORAL_TABLET | Freq: Two times a day (BID) | ORAL | 1 refills | Status: DC

## 2020-09-26 MED ORDER — AMLODIPINE BESYLATE 5 MG PO TABS: 5.0000 mg | ORAL_TABLET | Freq: Every day | ORAL | 3 refills | Status: DC

## 2020-09-26 MED ORDER — TRULICITY 0.75 MG/0.5ML ~~LOC~~ SOAJ: 0.7500 mg | SUBCUTANEOUS | 1 refills | Status: DC

## 2020-09-26 MED ORDER — PANTOPRAZOLE SODIUM 40 MG PO TBEC: 40.0000 mg | DELAYED_RELEASE_TABLET | Freq: Every day | ORAL | 1 refills | Status: DC

## 2020-09-26 MED FILL — metFORMIN HCL ER 500 MG TB2: 500 | 90 days supply | Qty: 360 | Fill #0

## 2020-09-26 MED FILL — AMLODIPINE BESYLATE 5 MG TA: 5 | 90 days supply | Qty: 90 | Fill #0

## 2020-09-26 MED FILL — TRULICITY 0.75 MG/0.5 ML PE: 0.75 | 28 days supply | Qty: 2 | Fill #0

## 2020-09-26 MED FILL — LISINOPRIL 10 MG TABS: 10 | 90 days supply | Qty: 90 | Fill #0

## 2020-09-26 NOTE — Patient Instructions (Addendum)
#  Diabetes - continue metformin - Start Trulicity - Mychart in 3-4 weeks if tolerating trulicity to discuss dose increase - if trulicity too expensive -- ask insurance for alternatives    #Hypertension  - Stop Metoprolol - Continue Amlodipine - Start lisinopril 10 mg daily

## 2020-09-26 NOTE — Assessment & Plan Note (Signed)
BP elevated, reports normal at home. Discussed benefit of ACE-inhibitor. Willing to try. Stop metoprolol. Start lisinopril 10 mg. Cont amlodipine. Return 4 weeks for labs and bp check

## 2020-09-26 NOTE — Assessment & Plan Note (Signed)
Trial of decreased used. She notes worse with weight. If unable to stop will refer to GI for evaluation. Cont pantoprazole.

## 2020-09-26 NOTE — Assessment & Plan Note (Signed)
Mild improvement with increased metformin. Cont metformin 1000 mg BID. Cont diet. Start lisinopril. Cont lipitor. Start trulicity 7.41 mg. Call in 1 month for dose increase if tolerated.

## 2020-09-26 NOTE — Progress Notes (Signed)
Subjective:     Lauren Avila is a 55 y.o. female presenting for Transitions Of Care (Due for DM eye exam. Will schedule soon and have them fax results. ) and Follow-up (3 month- DM )     HPI  #Diabetes Currently taking metformin (Glucophage, Riomet)  Using medications without difficulties: No Hypoglycemic episodes:No  Hyperglycemic episodes:Yes  Feet problems:No  Blood Sugars averaging: 138-180 (3-4 hours after eating) fasting is 180-200+ Last HgbA1c:  Lab Results  Component Value Date   HGBA1C 10.1 (A) 09/26/2020   Diet: veggies first, protein, eating for fullness, reduced fruit/processed food - avoiding carbs, sugary beverages - water 90% of the time  Diabetes Health Maintenance Due:    Diabetes Health Maintenance Due  Topic Date Due  . FOOT EXAM  Never done  . OPHTHALMOLOGY EXAM  Never done  . HEMOGLOBIN A1C  03/26/2021  . URINE MICROALBUMIN  06/22/2021    #HTN - bp at home 117-122/70s  #tongue swelling - will bite the inside of the tongue/jaw - also have sores -   #GERD - tried to wean herself off the medication - the symptoms came back  - she feels this is related to weight   Review of Systems     Social History   Tobacco Use  Smoking Status Never Smoker  Smokeless Tobacco Never Used        Objective:    BP Readings from Last 3 Encounters:  09/26/20 (!) 142/90  06/22/20 126/80  01/06/19 (!) 152/97   Wt Readings from Last 3 Encounters:  09/26/20 194 lb 8 oz (88.2 kg)  06/22/20 195 lb (88.5 kg)  01/06/19 204 lb (92.5 kg)    BP (!) 142/90   Pulse 62   Temp 97.8 F (36.6 C) (Temporal)   Ht 4\' 11"  (1.499 m)   Wt 194 lb 8 oz (88.2 kg)   SpO2 97%   BMI 39.28 kg/m    Physical Exam Constitutional:      General: She is not in acute distress.    Appearance: She is well-developed. She is not diaphoretic.  HENT:     Right Ear: External ear normal.     Left Ear: External ear normal.     Nose: Nose normal.      Mouth/Throat:     Mouth: Mucous membranes are moist.     Pharynx: No oropharyngeal exudate or posterior oropharyngeal erythema.     Comments: Normal appearing tongue Eyes:     Conjunctiva/sclera: Conjunctivae normal.  Cardiovascular:     Rate and Rhythm: Normal rate and regular rhythm.     Heart sounds: No murmur heard.   Pulmonary:     Effort: Pulmonary effort is normal. No respiratory distress.     Breath sounds: Normal breath sounds. No wheezing.  Musculoskeletal:     Cervical back: Neck supple.  Skin:    General: Skin is warm and dry.     Capillary Refill: Capillary refill takes less than 2 seconds.  Neurological:     Mental Status: She is alert. Mental status is at baseline.  Psychiatric:        Mood and Affect: Mood normal.        Behavior: Behavior normal.           Assessment & Plan:   Problem List Items Addressed This Visit      Cardiovascular and Mediastinum   Essential hypertension    BP elevated, reports normal at home. Discussed benefit of ACE-inhibitor. Willing  to try. Stop metoprolol. Start lisinopril 10 mg. Cont amlodipine. Return 4 weeks for labs and bp check      Relevant Medications   amLODipine (NORVASC) 5 MG tablet   lisinopril (ZESTRIL) 10 MG tablet     Digestive   Gastro-esophageal reflux disease without esophagitis    Trial of decreased used. She notes worse with weight. If unable to stop will refer to GI for evaluation. Cont pantoprazole.       Relevant Medications   pantoprazole (PROTONIX) 40 MG tablet     Endocrine   Type 2 diabetes mellitus with hyperglycemia, without long-term current use of insulin (HCC) - Primary    Mild improvement with increased metformin. Cont metformin 1000 mg BID. Cont diet. Start lisinopril. Cont lipitor. Start trulicity 5.42 mg. Call in 1 month for dose increase if tolerated.       Relevant Medications   metFORMIN (GLUCOPHAGE-XR) 500 MG 24 hr tablet   Dulaglutide (TRULICITY) 7.06 CB/7.6EG SOPN    lisinopril (ZESTRIL) 10 MG tablet   Other Relevant Orders   POCT glycosylated hemoglobin (Hb A1C) (Completed)       Return in about 4 weeks (around 10/24/2020) for blood pressure.  Lesleigh Noe, MD  This visit occurred during the SARS-CoV-2 public health emergency.  Safety protocols were in place, including screening questions prior to the visit, additional usage of staff PPE, and extensive cleaning of exam room while observing appropriate contact time as indicated for disinfecting solutions.

## 2020-09-27 ENCOUNTER — Encounter: Payer: Self-pay | Admitting: Family Medicine

## 2020-09-27 DIAGNOSIS — E1165 Type 2 diabetes mellitus with hyperglycemia: Secondary | ICD-10-CM

## 2020-09-30 MED FILL — ATORVASTATIN CALCIUM 20 MG: 20 | 90 days supply | Qty: 90 | Fill #1

## 2020-10-04 ENCOUNTER — Encounter: Payer: Self-pay | Admitting: Family Medicine

## 2020-10-04 DIAGNOSIS — K5790 Diverticulosis of intestine, part unspecified, without perforation or abscess without bleeding: Secondary | ICD-10-CM | POA: Insufficient documentation

## 2020-10-05 ENCOUNTER — Encounter: Payer: Self-pay | Admitting: Family Medicine

## 2020-10-05 ENCOUNTER — Other Ambulatory Visit: Payer: Self-pay | Admitting: Family Medicine

## 2020-10-05 DIAGNOSIS — Z1231 Encounter for screening mammogram for malignant neoplasm of breast: Secondary | ICD-10-CM

## 2020-10-18 ENCOUNTER — Other Ambulatory Visit: Payer: Self-pay | Admitting: Family Medicine

## 2020-10-18 MED ORDER — TRULICITY 1.5 MG/0.5ML ~~LOC~~ SOAJ: 1.5000 mg | SUBCUTANEOUS | 1 refills | Status: DC

## 2020-10-18 MED FILL — TRULICITY 1.5 MG/0.5 ML PEN: 1.5 | 28 days supply | Qty: 2 | Fill #0

## 2020-10-20 ENCOUNTER — Other Ambulatory Visit (HOSPITAL_BASED_OUTPATIENT_CLINIC_OR_DEPARTMENT_OTHER): Payer: Self-pay

## 2020-10-24 ENCOUNTER — Ambulatory Visit: Payer: 59 | Admitting: Family Medicine

## 2020-10-24 ENCOUNTER — Ambulatory Visit (INDEPENDENT_AMBULATORY_CARE_PROVIDER_SITE_OTHER): Payer: 59 | Admitting: Family Medicine

## 2020-10-24 ENCOUNTER — Other Ambulatory Visit: Payer: Self-pay

## 2020-10-24 ENCOUNTER — Encounter: Payer: Self-pay | Admitting: Family Medicine

## 2020-10-24 VITALS — BP 142/90 | HR 68 | Temp 96.9°F | Ht 59.0 in | Wt 193.8 lb

## 2020-10-24 DIAGNOSIS — I10 Essential (primary) hypertension: Secondary | ICD-10-CM | POA: Diagnosis not present

## 2020-10-24 DIAGNOSIS — E1165 Type 2 diabetes mellitus with hyperglycemia: Secondary | ICD-10-CM | POA: Diagnosis not present

## 2020-10-24 NOTE — Assessment & Plan Note (Signed)
BP elevated. Advised home monitoring and update via mychart in 2 weeks. Labs today. Cont lisinopril 10 mg and amlodipine 5 mg. Will increase lisinopril if not at goal on home monitoring

## 2020-10-24 NOTE — Patient Instructions (Addendum)
Great work on the Blood glucose control!  Send a Mychart message with home blood pressure in 2 weeks   Your blood pressure high.   High blood pressure increases your risk for heart attack and stroke.    Please check your blood pressure 2-4 times a week.   To check your blood pressure 1) Sit in a quiet and relaxed place for 5 minutes 2) Make sure your feet are flat on the ground 3) Consider checking first thing in the morning   Normal blood pressure is less than 140/90 Ideally you blood pressure should be around 120/80  Other ways you can reduce your blood pressure:  1) Regular exercise -- Try to get 150 minutes (30 minutes, 5 days a week) of moderate to vigorous aerobic excercise -- Examples: brisk walking (2.5 miles per hour), water aerobics, dancing, gardening, tennis, biking slower than 10 miles per hour 2) DASH Diet - low fat meats, more fresh fruits and vegetables, whole grains, low salt 3) Quit smoking if you smoke 4) Loose 5-10% of your body weight

## 2020-10-24 NOTE — Assessment & Plan Note (Signed)
CBG 130-140 at home. Improved control with diet and trulicity 1.5 mg and metformin 1000 mg bid. Cont both. Return in 2 months for OV

## 2020-10-24 NOTE — Progress Notes (Signed)
Subjective:     Lauren Avila is a 55 y.o. female presenting for Follow-up (BP )     HPI  #HTN - has been taking medication - started lisinopril - no new symptoms - stopped metoprolol - continues amlodipine - has noticed more frequent HA  - is not eating as much with trulicity - eating when she hungry   #Diabetes - doing well on metformin and trulicity - did increase the medication  - average CBG 130-140 - after meals - highest was 175 - working on diet changes  Review of Systems  09/26/2020: clinic - HTN - stop metoprolol. Start lisinopril 10 mg. cont amlodipine. DM - metform 1000 mg bid, start trulicity 1.09 mg. Increased at 1 month.   Social History   Tobacco Use  Smoking Status Never Smoker  Smokeless Tobacco Never Used        Objective:    BP Readings from Last 3 Encounters:  10/24/20 (!) 142/90  09/26/20 (!) 142/90  06/22/20 126/80   Wt Readings from Last 3 Encounters:  10/24/20 193 lb 12 oz (87.9 kg)  09/26/20 194 lb 8 oz (88.2 kg)  06/22/20 195 lb (88.5 kg)    BP (!) 142/90   Pulse 68   Temp (!) 96.9 F (36.1 C) (Temporal)   Ht 4\' 11"  (1.499 m)   Wt 193 lb 12 oz (87.9 kg)   SpO2 97%   BMI 39.13 kg/m    Physical Exam Constitutional:      General: She is not in acute distress.    Appearance: She is well-developed. She is not diaphoretic.  HENT:     Right Ear: External ear normal.     Left Ear: External ear normal.  Eyes:     Conjunctiva/sclera: Conjunctivae normal.  Cardiovascular:     Rate and Rhythm: Normal rate and regular rhythm.     Heart sounds: No murmur heard.   Pulmonary:     Effort: Pulmonary effort is normal. No respiratory distress.     Breath sounds: Normal breath sounds. No wheezing.  Musculoskeletal:     Cervical back: Neck supple.  Skin:    General: Skin is warm and dry.     Capillary Refill: Capillary refill takes less than 2 seconds.  Neurological:     Mental Status: She is alert. Mental status is  at baseline.  Psychiatric:        Mood and Affect: Mood normal.        Behavior: Behavior normal.           Assessment & Plan:   Problem List Items Addressed This Visit      Cardiovascular and Mediastinum   Essential hypertension    BP elevated. Advised home monitoring and update via mychart in 2 weeks. Labs today. Cont lisinopril 10 mg and amlodipine 5 mg. Will increase lisinopril if not at goal on home monitoring      Relevant Orders   Basic metabolic panel     Endocrine   Type 2 diabetes mellitus with hyperglycemia, without long-term current use of insulin (HCC) - Primary    CBG 130-140 at home. Improved control with diet and trulicity 1.5 mg and metformin 1000 mg bid. Cont both. Return in 2 months for OV          Return in about 2 months (around 12/24/2020).  Lesleigh Noe, MD  This visit occurred during the SARS-CoV-2 public health emergency.  Safety protocols were in place, including screening questions  prior to the visit, additional usage of staff PPE, and extensive cleaning of exam room while observing appropriate contact time as indicated for disinfecting solutions.

## 2020-10-25 LAB — BASIC METABOLIC PANEL
BUN: 13 mg/dL (ref 6–23)
CO2: 29 mEq/L (ref 19–32)
Calcium: 9.8 mg/dL (ref 8.4–10.5)
Chloride: 104 mEq/L (ref 96–112)
Creatinine, Ser: 0.82 mg/dL (ref 0.40–1.20)
GFR: 80 mL/min (ref >60.00)
Glucose, Bld: 174 mg/dL — ABNORMAL HIGH (ref 70–99)
Potassium: 3.6 mEq/L (ref 3.5–5.1)
Sodium: 141 mEq/L (ref 135–145)

## 2020-10-27 MED FILL — PANTOPRAZOLE SOD DR 40 MG T: 40 | 90 days supply | Qty: 90 | Fill #0

## 2020-11-08 ENCOUNTER — Encounter: Payer: Self-pay | Admitting: Family Medicine

## 2020-11-11 ENCOUNTER — Other Ambulatory Visit (HOSPITAL_COMMUNITY): Payer: Self-pay

## 2020-11-11 MED FILL — Dulaglutide Soln Auto-injector 1.5 MG/0.5ML: SUBCUTANEOUS | 28 days supply | Qty: 2 | Fill #0 | Status: AC

## 2020-11-14 ENCOUNTER — Other Ambulatory Visit (HOSPITAL_COMMUNITY): Payer: Self-pay

## 2020-11-17 ENCOUNTER — Inpatient Hospital Stay: Admission: RE | Admit: 2020-11-17 | Payer: 59 | Source: Ambulatory Visit

## 2020-11-17 DIAGNOSIS — Z1231 Encounter for screening mammogram for malignant neoplasm of breast: Secondary | ICD-10-CM

## 2020-11-18 ENCOUNTER — Ambulatory Visit
Admission: RE | Admit: 2020-11-18 | Discharge: 2020-11-18 | Disposition: A | Discharge status: Home / Self Care | Payer: 59 | Source: Ambulatory Visit | Attending: Family Medicine | Admitting: Family Medicine

## 2020-11-18 ENCOUNTER — Other Ambulatory Visit: Payer: Self-pay

## 2020-11-18 DIAGNOSIS — Z1231 Encounter for screening mammogram for malignant neoplasm of breast: Secondary | ICD-10-CM | POA: Diagnosis not present

## 2020-11-22 ENCOUNTER — Other Ambulatory Visit (HOSPITAL_COMMUNITY): Payer: Self-pay

## 2020-11-22 MED ORDER — CARESTART COVID-19 HOME TEST VI KIT
PACK | 0 refills | Status: DC
  Filled 2020-11-22: qty 2, 2d supply, fill #0

## 2020-12-12 ENCOUNTER — Other Ambulatory Visit: Payer: Self-pay | Admitting: Family Medicine

## 2020-12-12 ENCOUNTER — Other Ambulatory Visit (HOSPITAL_COMMUNITY): Payer: Self-pay

## 2020-12-12 DIAGNOSIS — E1165 Type 2 diabetes mellitus with hyperglycemia: Secondary | ICD-10-CM

## 2020-12-12 MED ORDER — TRULICITY 1.5 MG/0.5ML ~~LOC~~ SOAJ
SUBCUTANEOUS | 1 refills | Status: DC
  Filled 2020-12-12: qty 2, 28d supply, fill #0

## 2020-12-12 MED FILL — Cholecalciferol Cap 1.25 MG (50000 Unit): ORAL | 84 days supply | Qty: 12 | Fill #0 | Status: AC

## 2020-12-13 DIAGNOSIS — E119 Type 2 diabetes mellitus without complications: Secondary | ICD-10-CM | POA: Diagnosis not present

## 2020-12-13 DIAGNOSIS — H524 Presbyopia: Secondary | ICD-10-CM | POA: Diagnosis not present

## 2020-12-13 DIAGNOSIS — E089 Diabetes mellitus due to underlying condition without complications: Secondary | ICD-10-CM | POA: Diagnosis not present

## 2020-12-13 DIAGNOSIS — H35363 Drusen (degenerative) of macula, bilateral: Secondary | ICD-10-CM | POA: Diagnosis not present

## 2020-12-13 LAB — HM DIABETES EYE EXAM

## 2020-12-16 ENCOUNTER — Other Ambulatory Visit (HOSPITAL_COMMUNITY): Payer: Self-pay

## 2020-12-27 ENCOUNTER — Other Ambulatory Visit (HOSPITAL_COMMUNITY): Payer: Self-pay

## 2020-12-27 ENCOUNTER — Ambulatory Visit (INDEPENDENT_AMBULATORY_CARE_PROVIDER_SITE_OTHER): Payer: 59 | Admitting: Family Medicine

## 2020-12-27 ENCOUNTER — Other Ambulatory Visit: Payer: Self-pay

## 2020-12-27 VITALS — BP 156/90 | HR 88 | Temp 97.0°F | Ht 59.0 in | Wt 195.2 lb

## 2020-12-27 DIAGNOSIS — I10 Essential (primary) hypertension: Secondary | ICD-10-CM

## 2020-12-27 DIAGNOSIS — E785 Hyperlipidemia, unspecified: Secondary | ICD-10-CM

## 2020-12-27 DIAGNOSIS — E1165 Type 2 diabetes mellitus with hyperglycemia: Secondary | ICD-10-CM

## 2020-12-27 DIAGNOSIS — E1169 Type 2 diabetes mellitus with other specified complication: Secondary | ICD-10-CM | POA: Diagnosis not present

## 2020-12-27 DIAGNOSIS — Z1159 Encounter for screening for other viral diseases: Secondary | ICD-10-CM

## 2020-12-27 DIAGNOSIS — Z114 Encounter for screening for human immunodeficiency virus [HIV]: Secondary | ICD-10-CM

## 2020-12-27 DIAGNOSIS — E782 Mixed hyperlipidemia: Secondary | ICD-10-CM | POA: Diagnosis not present

## 2020-12-27 LAB — POCT GLYCOSYLATED HEMOGLOBIN (HGB A1C): Hemoglobin A1C: 8.7 % — AB (ref 4.0–5.6)

## 2020-12-27 MED ORDER — GLUCOSE BLOOD VI STRP
ORAL_STRIP | 1 refills | Status: AC
Start: 2020-12-27 — End: ?
  Filled 2020-12-27: qty 100, 90d supply, fill #0

## 2020-12-27 MED ORDER — TRULICITY 3 MG/0.5ML ~~LOC~~ SOAJ
3.0000 mg | SUBCUTANEOUS | 1 refills | Status: DC
  Filled 2020-12-27: qty 4, 56d supply, fill #0

## 2020-12-27 MED ORDER — LISINOPRIL-HYDROCHLOROTHIAZIDE 10-12.5 MG PO TABS
1.0000 | ORAL_TABLET | Freq: Every day | ORAL | 0 refills | Status: DC
  Filled 2020-12-27: qty 90, 90d supply, fill #0

## 2020-12-27 MED ORDER — FREESTYLE LANCETS MISC
1 refills | Status: AC
  Filled 2020-12-27: qty 100, 90d supply, fill #0

## 2020-12-27 NOTE — Assessment & Plan Note (Signed)
Taking atorvastatin 20 mg. Will check lipids with next lab draw.

## 2020-12-27 NOTE — Progress Notes (Signed)
Subjective:     Lauren Avila is a 55 y.o. female presenting for Follow-up (DM )     HPI  #Diabetes Currently taking metformin and trulicity  Using medications without difficulties: No Hypoglycemic episodes:No  Hyperglycemic episodes:No  Feet problems:improved Blood Sugars averaging: 130-150 Last HgbA1c:  Lab Results  Component Value Date   HGBA1C 8.7 (A) 12/27/2020   Diet: getting better   Diabetes Health Maintenance Due:    Diabetes Health Maintenance Due  Topic Date Due  . OPHTHALMOLOGY EXAM  Never done  . HEMOGLOBIN A1C  06/28/2021  . FOOT EXAM  12/27/2021   #HTN - continues to take lisinopril 10 mg and amlodipine 5 mg - has been check bp at home - typically high many days - also notices that it is high when her blood sugar is high - joined an exercise class once a week, walks at her job - 6-10k steps per day  Review of Systems  10/24/2020: Clinic - HTN - increase lisinopril. DM - improved control. cont trulicity and metformin  Social History   Tobacco Use  Smoking Status Never Smoker  Smokeless Tobacco Never Used        Objective:    BP Readings from Last 3 Encounters:  12/27/20 (!) 156/90  10/24/20 (!) 142/90  09/26/20 (!) 142/90   Wt Readings from Last 3 Encounters:  12/27/20 195 lb 4 oz (88.6 kg)  10/24/20 193 lb 12 oz (87.9 kg)  09/26/20 194 lb 8 oz (88.2 kg)    BP (!) 156/90   Pulse 88   Temp (!) 97 F (36.1 C) (Temporal)   Ht 4\' 11"  (1.499 m)   Wt 195 lb 4 oz (88.6 kg)   SpO2 97%   BMI 39.44 kg/m    Physical Exam Constitutional:      General: She is not in acute distress.    Appearance: She is well-developed. She is not diaphoretic.  HENT:     Right Ear: External ear normal.     Left Ear: External ear normal.  Eyes:     Conjunctiva/sclera: Conjunctivae normal.  Cardiovascular:     Rate and Rhythm: Normal rate and regular rhythm.     Heart sounds: No murmur heard.   Pulmonary:     Effort: Pulmonary effort is  normal. No respiratory distress.     Breath sounds: Normal breath sounds. No wheezing.  Musculoskeletal:     Cervical back: Neck supple.  Skin:    General: Skin is warm and dry.     Capillary Refill: Capillary refill takes less than 2 seconds.  Neurological:     Mental Status: She is alert. Mental status is at baseline.  Psychiatric:        Mood and Affect: Mood normal.        Behavior: Behavior normal.           Assessment & Plan:   Problem List Items Addressed This Visit      Cardiovascular and Mediastinum   Essential hypertension    BP elevated. Cont amlodipine 5 mg. Start Lisinopril-HCTZ 10-12.5 and stop lisinopril alone. Labs in 2 weeks with mychart update.       Relevant Medications   lisinopril-hydrochlorothiazide (ZESTORETIC) 10-12.5 MG tablet   Other Relevant Orders   Basic metabolic panel     Endocrine   Type 2 diabetes mellitus with hyperglycemia, without long-term current use of insulin ([place]) - Primary    Lab Results  Component Value Date   HGBA1C  8.7 (A) 12/27/2020   Improved, but still poorly controlled. Cont diet. Increase trulicity 1.5 > 3 mg. Will check back in 1 month for another dose increase if tolerating. Cont metformin 1000 mg bid. Return 3 months      Relevant Medications   Lancets (FREESTYLE) lancets   glucose blood test strip   Dulaglutide (TRULICITY) 3 EU/9.9AW SOPN   lisinopril-hydrochlorothiazide (ZESTORETIC) 10-12.5 MG tablet   Other Relevant Orders   POCT glycosylated hemoglobin (Hb A1C) (Completed)   Hyperlipidemia associated with type 2 diabetes mellitus (HCC)    Taking atorvastatin 20 mg. Will check lipids with next lab draw.       Relevant Medications   Dulaglutide (TRULICITY) 3 UJ/3.4MG SOPN   lisinopril-hydrochlorothiazide (ZESTORETIC) 10-12.5 MG tablet    Other Visit Diagnoses    Screening for HIV (human immunodeficiency virus)       Relevant Orders   HIV Antibody (routine testing w rflx)   Need for hepatitis C  screening test       Relevant Orders   Hepatitis C antibody       Return in about 3 months (around 03/29/2021) for Diabetes.  Lesleigh Noe, MD  This visit occurred during the SARS-CoV-2 public health emergency.  Safety protocols were in place, including screening questions prior to the visit, additional usage of staff PPE, and extensive cleaning of exam room while observing appropriate contact time as indicated for disinfecting solutions.

## 2020-12-27 NOTE — Patient Instructions (Signed)
#  diabetes - stop the 1.5 mg Trulicity - Start 3 mg Trulicity - send Mychart in 2-3 weeks if tolerating so we can plan to increase to maximum dose of 4.5 mg  #Blood pressure - Stop lisinopril - Continue Amlodipine - START new combo pill Lisinopril-HCTZ 10-12.5 mg  Return for labs and mychart check-in in about 2 weeks

## 2020-12-27 NOTE — Assessment & Plan Note (Signed)
BP elevated. Cont amlodipine 5 mg. Start Lisinopril-HCTZ 10-12.5 and stop lisinopril alone. Labs in 2 weeks with mychart update.

## 2020-12-27 NOTE — Assessment & Plan Note (Signed)
Lab Results  Component Value Date   HGBA1C 8.7 (A) 12/27/2020   Improved, but still poorly controlled. Cont diet. Increase trulicity 1.5 > 3 mg. Will check back in 1 month for another dose increase if tolerating. Cont metformin 1000 mg bid. Return 3 months

## 2020-12-28 ENCOUNTER — Other Ambulatory Visit (HOSPITAL_COMMUNITY): Payer: Self-pay

## 2021-01-02 DIAGNOSIS — G4733 Obstructive sleep apnea (adult) (pediatric): Secondary | ICD-10-CM | POA: Diagnosis not present

## 2021-01-03 ENCOUNTER — Encounter: Payer: Self-pay | Admitting: Family Medicine

## 2021-01-04 ENCOUNTER — Other Ambulatory Visit (HOSPITAL_COMMUNITY): Payer: Self-pay

## 2021-01-04 MED FILL — Metformin HCl Tab ER 24HR 500 MG: ORAL | 90 days supply | Qty: 360 | Fill #0 | Status: AC

## 2021-01-06 ENCOUNTER — Other Ambulatory Visit (HOSPITAL_COMMUNITY): Payer: Self-pay

## 2021-01-06 ENCOUNTER — Ambulatory Visit: Payer: 59

## 2021-01-06 MED FILL — Amlodipine Besylate Tab 5 MG (Base Equivalent): ORAL | 90 days supply | Qty: 90 | Fill #0 | Status: AC

## 2021-01-06 MED FILL — Atorvastatin Calcium Tab 20 MG (Base Equivalent): ORAL | 90 days supply | Qty: 90 | Fill #0 | Status: AC

## 2021-01-10 ENCOUNTER — Other Ambulatory Visit: Payer: Self-pay

## 2021-01-10 ENCOUNTER — Other Ambulatory Visit (INDEPENDENT_AMBULATORY_CARE_PROVIDER_SITE_OTHER): Payer: 59

## 2021-01-10 DIAGNOSIS — E782 Mixed hyperlipidemia: Secondary | ICD-10-CM | POA: Diagnosis not present

## 2021-01-10 DIAGNOSIS — I10 Essential (primary) hypertension: Secondary | ICD-10-CM

## 2021-01-10 DIAGNOSIS — Z1159 Encounter for screening for other viral diseases: Secondary | ICD-10-CM

## 2021-01-10 DIAGNOSIS — Z114 Encounter for screening for human immunodeficiency virus [HIV]: Secondary | ICD-10-CM | POA: Diagnosis not present

## 2021-01-10 LAB — LIPID PANEL
Cholesterol: 138 mg/dL (ref 0–200)
HDL: 44.8 mg/dL (ref >39.00)
LDL Cholesterol: 71 mg/dL (ref 0–99)
NonHDL: 92.95
Total CHOL/HDL Ratio: 3
Triglycerides: 110 mg/dL (ref 0.0–149.0)
VLDL: 22 mg/dL (ref 0.0–40.0)

## 2021-01-10 LAB — BASIC METABOLIC PANEL
BUN: 13 mg/dL (ref 6–23)
CO2: 30 mEq/L (ref 19–32)
Calcium: 9.2 mg/dL (ref 8.4–10.5)
Chloride: 101 mEq/L (ref 96–112)
Creatinine, Ser: 0.78 mg/dL (ref 0.40–1.20)
GFR: 84.82 mL/min (ref >60.00)
Glucose, Bld: 136 mg/dL — ABNORMAL HIGH (ref 70–99)
Potassium: 3.9 mEq/L (ref 3.5–5.1)
Sodium: 140 mEq/L (ref 135–145)

## 2021-01-11 LAB — HEPATITIS C ANTIBODY
Hepatitis C Ab: NONREACTIVE
SIGNAL TO CUT-OFF: 0 (ref <1.00)

## 2021-01-11 LAB — HIV ANTIBODY (ROUTINE TESTING W REFLEX): HIV 1&2 Ab, 4th Generation: NONREACTIVE

## 2021-01-13 ENCOUNTER — Encounter: Payer: Self-pay | Admitting: Family Medicine

## 2021-02-01 DIAGNOSIS — G4733 Obstructive sleep apnea (adult) (pediatric): Secondary | ICD-10-CM | POA: Diagnosis not present

## 2021-02-02 ENCOUNTER — Other Ambulatory Visit (HOSPITAL_COMMUNITY): Payer: Self-pay

## 2021-02-08 ENCOUNTER — Encounter: Payer: Self-pay | Admitting: Family Medicine

## 2021-02-20 ENCOUNTER — Other Ambulatory Visit: Payer: Self-pay | Admitting: Family Medicine

## 2021-02-20 ENCOUNTER — Other Ambulatory Visit (HOSPITAL_COMMUNITY): Payer: Self-pay

## 2021-02-20 DIAGNOSIS — E1165 Type 2 diabetes mellitus with hyperglycemia: Secondary | ICD-10-CM

## 2021-02-20 DIAGNOSIS — Z20822 Contact with and (suspected) exposure to covid-19: Secondary | ICD-10-CM | POA: Diagnosis not present

## 2021-02-20 MED ORDER — CARESTART COVID-19 HOME TEST VI KIT
PACK | 0 refills | Status: DC
  Filled 2021-02-20: qty 4, 4d supply, fill #0

## 2021-02-20 MED FILL — Pantoprazole Sodium EC Tab 40 MG (Base Equiv): ORAL | 90 days supply | Qty: 90 | Fill #0 | Status: AC

## 2021-02-21 ENCOUNTER — Encounter: Payer: Self-pay | Admitting: Family Medicine

## 2021-02-21 ENCOUNTER — Other Ambulatory Visit (HOSPITAL_COMMUNITY): Payer: Self-pay

## 2021-02-21 MED ORDER — TRULICITY 3 MG/0.5ML ~~LOC~~ SOAJ
3.0000 mg | SUBCUTANEOUS | 1 refills | Status: DC
  Filled 2021-02-21: qty 2, 28d supply, fill #0
  Filled 2021-03-29: qty 2, 28d supply, fill #1

## 2021-03-04 DIAGNOSIS — G4733 Obstructive sleep apnea (adult) (pediatric): Secondary | ICD-10-CM | POA: Diagnosis not present

## 2021-03-17 ENCOUNTER — Other Ambulatory Visit (HOSPITAL_COMMUNITY): Payer: Self-pay

## 2021-03-17 MED ORDER — QUICKVUE AT-HOME COVID-19 TEST VI KIT
PACK | 0 refills | Status: DC
  Filled 2021-03-17: qty 2, 2d supply, fill #0

## 2021-03-29 ENCOUNTER — Ambulatory Visit (INDEPENDENT_AMBULATORY_CARE_PROVIDER_SITE_OTHER): Payer: 59 | Admitting: Family Medicine

## 2021-03-29 ENCOUNTER — Other Ambulatory Visit (HOSPITAL_COMMUNITY): Payer: Self-pay

## 2021-03-29 ENCOUNTER — Other Ambulatory Visit: Payer: Self-pay

## 2021-03-29 DIAGNOSIS — E1165 Type 2 diabetes mellitus with hyperglycemia: Secondary | ICD-10-CM

## 2021-03-29 DIAGNOSIS — I10 Essential (primary) hypertension: Secondary | ICD-10-CM

## 2021-03-29 LAB — POCT GLYCOSYLATED HEMOGLOBIN (HGB A1C): Hemoglobin A1C: 8.7 % — AB (ref 4.0–5.6)

## 2021-03-29 MED ORDER — LISINOPRIL-HYDROCHLOROTHIAZIDE 10-12.5 MG PO TABS
1.0000 | ORAL_TABLET | Freq: Every day | ORAL | 0 refills | Status: DC
  Filled 2021-03-29: qty 90, 90d supply, fill #0

## 2021-03-29 MED ORDER — METFORMIN HCL ER 500 MG PO TB24
ORAL_TABLET | ORAL | 1 refills | Status: DC
  Filled 2021-03-29: qty 360, 90d supply, fill #0
  Filled 2021-07-20: qty 360, 90d supply, fill #1

## 2021-03-29 MED ORDER — EMPAGLIFLOZIN 10 MG PO TABS
10.0000 mg | ORAL_TABLET | Freq: Every day | ORAL | 0 refills | Status: DC
  Filled 2021-03-29: qty 30, 30d supply, fill #0

## 2021-03-29 NOTE — Assessment & Plan Note (Signed)
Lab Results  Component Value Date   HGBA1C 8.7 (A) 03/29/2021   Poorly controlled. Suspect some dietary slippage. Start Jardiance 10 mg with plan to increase to 25 mg if tolerated. She will continue trulicity at 3 mg as some nausea (consider switch to ozempic if no additional weight loss) . Cont metformin 1000 mg bid. Return 3 months.

## 2021-03-29 NOTE — Patient Instructions (Addendum)
#  Diabetes - Start Jardiance - update in 3-4 weeks - May consider increase dose of Jardiance - if weight unchanged - can also consider ozempic (may want to check with insurance)   Mychart update - side effects to Jardiance - weight updated - do you want to switch - blood pressure pressure

## 2021-03-29 NOTE — Progress Notes (Signed)
Subjective:     Lauren Avila is a 55 y.o. female presenting for Follow-up (3 mo- DM )     HPI   #Diabetes Currently taking metformin and trulicity  Using medications without difficulties: No - does have anxiety with injecting the trulicity- will get nausea with the trulicity so would skip her metformin for 1/2 dose  Feet problems:No  Blood Sugars averaging: 130-180 Last HgbA1c:  Lab Results  Component Value Date   HGBA1C 8.7 (A) 03/29/2021    Diabetes Health Maintenance Due:    Diabetes Health Maintenance Due  Topic Date Due   HEMOGLOBIN A1C  09/26/2021   OPHTHALMOLOGY EXAM  12/13/2021   FOOT EXAM  12/27/2021   Was doing well on diet but stopped Weight has been stable Is eating sweets occasionally  Is back to drinking water and coffee with creamer Feels she can get back on track with diet  #HTN - has been checking at home - since covid has been in the "yellow" range - was doing well prior that    Review of Systems   Social History   Tobacco Use  Smoking Status Never  Smokeless Tobacco Never        Objective:    BP Readings from Last 3 Encounters:  03/29/21 (!) 146/90  12/27/20 (!) 156/90  10/24/20 (!) 142/90   Wt Readings from Last 3 Encounters:  03/29/21 194 lb 8 oz (88.2 kg)  12/27/20 195 lb 4 oz (88.6 kg)  10/24/20 193 lb 12 oz (87.9 kg)    BP (!) 146/90   Pulse 68   Temp 98.1 F (36.7 C) (Temporal)   Ht '4\' 11"'$  (1.499 m)   Wt 194 lb 8 oz (88.2 kg)   SpO2 95%   BMI 39.28 kg/m    Physical Exam Constitutional:      General: She is not in acute distress.    Appearance: She is well-developed. She is not diaphoretic.  HENT:     Right Ear: External ear normal.     Left Ear: External ear normal.  Eyes:     Conjunctiva/sclera: Conjunctivae normal.  Cardiovascular:     Rate and Rhythm: Normal rate and regular rhythm.     Heart sounds: No murmur heard. Pulmonary:     Effort: Pulmonary effort is normal.     Breath sounds:  Normal breath sounds.  Musculoskeletal:     Cervical back: Neck supple.  Skin:    General: Skin is warm and dry.     Capillary Refill: Capillary refill takes less than 2 seconds.  Neurological:     Mental Status: She is alert. Mental status is at baseline.  Psychiatric:        Mood and Affect: Mood normal.        Behavior: Behavior normal.          Assessment & Plan:   Problem List Items Addressed This Visit       Cardiovascular and Mediastinum   Essential hypertension    BP elevated in the office. She notes previously well controlled prior to covid. Will do home monitoring and update in 1 month. Cont amlodipine 5 mg, lisinopril-hctz 10-12.5. If still elevated will increase amlodipine first.       Relevant Medications   lisinopril-hydrochlorothiazide (ZESTORETIC) 10-12.5 MG tablet     Endocrine   Type 2 diabetes mellitus with hyperglycemia, without long-term current use of insulin (HCC)    Lab Results  Component Value Date   HGBA1C 8.7 (  A) 03/29/2021  Poorly controlled. Suspect some dietary slippage. Start Jardiance 10 mg with plan to increase to 25 mg if tolerated. She will continue trulicity at 3 mg as some nausea (consider switch to ozempic if no additional weight loss) . Cont metformin 1000 mg bid. Return 3 months.       Relevant Medications   lisinopril-hydrochlorothiazide (ZESTORETIC) 10-12.5 MG tablet   metFORMIN (GLUCOPHAGE-XR) 500 MG 24 hr tablet   empagliflozin (JARDIANCE) 10 MG TABS tablet   Other Relevant Orders   POCT glycosylated hemoglobin (Hb A1C) (Completed)     Return in about 3 months (around 06/28/2021) for Either Allie Bossier or [employer] .  Lesleigh Noe, MD  This visit occurred during the SARS-CoV-2 public health emergency.  Safety protocols were in place, including screening questions prior to the visit, additional usage of staff PPE, and extensive cleaning of exam room while observing appropriate contact time as indicated for disinfecting  solutions.

## 2021-03-29 NOTE — Assessment & Plan Note (Signed)
BP elevated in the office. She notes previously well controlled prior to covid. Will do home monitoring and update in 1 month. Cont amlodipine 5 mg, lisinopril-hctz 10-12.5. If still elevated will increase amlodipine first.

## 2021-04-04 DIAGNOSIS — G4733 Obstructive sleep apnea (adult) (pediatric): Secondary | ICD-10-CM | POA: Diagnosis not present

## 2021-04-11 ENCOUNTER — Encounter: Payer: Self-pay | Admitting: Family Medicine

## 2021-04-25 ENCOUNTER — Other Ambulatory Visit (HOSPITAL_COMMUNITY): Payer: Self-pay

## 2021-04-25 MED FILL — Atorvastatin Calcium Tab 20 MG (Base Equivalent): ORAL | 90 days supply | Qty: 90 | Fill #1 | Status: AC

## 2021-04-26 ENCOUNTER — Other Ambulatory Visit (HOSPITAL_COMMUNITY): Payer: Self-pay

## 2021-04-26 ENCOUNTER — Other Ambulatory Visit: Payer: Self-pay | Admitting: Family Medicine

## 2021-04-26 DIAGNOSIS — E1165 Type 2 diabetes mellitus with hyperglycemia: Secondary | ICD-10-CM

## 2021-04-26 MED ORDER — EMPAGLIFLOZIN 10 MG PO TABS
10.0000 mg | ORAL_TABLET | Freq: Every day | ORAL | 1 refills | Status: DC
  Filled 2021-04-26: qty 30, 30d supply, fill #0
  Filled 2021-05-24: qty 30, 30d supply, fill #1

## 2021-05-03 ENCOUNTER — Other Ambulatory Visit (HOSPITAL_COMMUNITY): Payer: Self-pay

## 2021-05-03 ENCOUNTER — Other Ambulatory Visit: Payer: Self-pay | Admitting: Family Medicine

## 2021-05-03 DIAGNOSIS — K219 Gastro-esophageal reflux disease without esophagitis: Secondary | ICD-10-CM

## 2021-05-03 DIAGNOSIS — E1165 Type 2 diabetes mellitus with hyperglycemia: Secondary | ICD-10-CM

## 2021-05-03 MED FILL — Amlodipine Besylate Tab 5 MG (Base Equivalent): ORAL | 90 days supply | Qty: 90 | Fill #1 | Status: AC

## 2021-05-04 ENCOUNTER — Other Ambulatory Visit (HOSPITAL_COMMUNITY): Payer: Self-pay

## 2021-05-04 DIAGNOSIS — G4733 Obstructive sleep apnea (adult) (pediatric): Secondary | ICD-10-CM | POA: Diagnosis not present

## 2021-05-05 ENCOUNTER — Other Ambulatory Visit (HOSPITAL_COMMUNITY): Payer: Self-pay

## 2021-05-05 MED ORDER — TRULICITY 3 MG/0.5ML ~~LOC~~ SOAJ
3.0000 mg | SUBCUTANEOUS | 1 refills | Status: DC
  Filled 2021-05-05: qty 4, 56d supply, fill #0

## 2021-05-05 MED ORDER — PANTOPRAZOLE SODIUM 40 MG PO TBEC
DELAYED_RELEASE_TABLET | Freq: Every day | ORAL | 1 refills | Status: DC
  Filled 2021-05-05: qty 90, 90d supply, fill #0
  Filled 2021-10-31: qty 90, 90d supply, fill #1

## 2021-05-24 ENCOUNTER — Other Ambulatory Visit (HOSPITAL_COMMUNITY): Payer: Self-pay

## 2021-06-04 DIAGNOSIS — G4733 Obstructive sleep apnea (adult) (pediatric): Secondary | ICD-10-CM | POA: Diagnosis not present

## 2021-06-14 ENCOUNTER — Other Ambulatory Visit (HOSPITAL_COMMUNITY): Payer: Self-pay

## 2021-06-14 ENCOUNTER — Other Ambulatory Visit: Payer: Self-pay | Admitting: Family Medicine

## 2021-06-14 DIAGNOSIS — E1165 Type 2 diabetes mellitus with hyperglycemia: Secondary | ICD-10-CM

## 2021-06-14 MED ORDER — TRULICITY 3 MG/0.5ML ~~LOC~~ SOAJ
3.0000 mg | SUBCUTANEOUS | 0 refills | Status: DC
Start: 2021-06-14 — End: 2021-07-20
  Filled 2021-06-14 – 2021-06-20 (×2): qty 2, 28d supply, fill #0

## 2021-06-20 ENCOUNTER — Other Ambulatory Visit (HOSPITAL_COMMUNITY): Payer: Self-pay

## 2021-06-20 ENCOUNTER — Other Ambulatory Visit: Payer: Self-pay | Admitting: Family Medicine

## 2021-06-20 DIAGNOSIS — E1165 Type 2 diabetes mellitus with hyperglycemia: Secondary | ICD-10-CM

## 2021-06-21 ENCOUNTER — Other Ambulatory Visit (HOSPITAL_COMMUNITY): Payer: Self-pay

## 2021-06-21 MED ORDER — EMPAGLIFLOZIN 10 MG PO TABS
10.0000 mg | ORAL_TABLET | Freq: Every day | ORAL | 0 refills | Status: DC
  Filled 2021-06-21: qty 30, 30d supply, fill #0

## 2021-06-28 ENCOUNTER — Other Ambulatory Visit (HOSPITAL_COMMUNITY): Payer: Self-pay

## 2021-06-28 ENCOUNTER — Ambulatory Visit (INDEPENDENT_AMBULATORY_CARE_PROVIDER_SITE_OTHER): Payer: 59 | Admitting: Nurse Practitioner

## 2021-06-28 ENCOUNTER — Encounter: Payer: Self-pay | Admitting: Nurse Practitioner

## 2021-06-28 ENCOUNTER — Other Ambulatory Visit: Payer: Self-pay

## 2021-06-28 VITALS — BP 142/86 | HR 74 | Temp 97.9°F | Ht 59.0 in | Wt 191.2 lb

## 2021-06-28 DIAGNOSIS — E785 Hyperlipidemia, unspecified: Secondary | ICD-10-CM

## 2021-06-28 DIAGNOSIS — E1169 Type 2 diabetes mellitus with other specified complication: Secondary | ICD-10-CM | POA: Diagnosis not present

## 2021-06-28 DIAGNOSIS — I10 Essential (primary) hypertension: Secondary | ICD-10-CM

## 2021-06-28 DIAGNOSIS — E1165 Type 2 diabetes mellitus with hyperglycemia: Secondary | ICD-10-CM | POA: Diagnosis not present

## 2021-06-28 LAB — CBC
HCT: 41.4 % (ref 36.0–46.0)
Hemoglobin: 13.3 g/dL (ref 12.0–15.0)
MCHC: 32.2 g/dL (ref 30.0–36.0)
MCV: 84.1 fl (ref 78.0–100.0)
Platelets: 322 10*3/uL (ref 150.0–400.0)
RBC: 4.92 Mil/uL (ref 3.87–5.11)
RDW: 13.9 % (ref 11.5–15.5)
WBC: 5.8 10*3/uL (ref 4.0–10.5)

## 2021-06-28 LAB — COMPREHENSIVE METABOLIC PANEL
ALT: 35 U/L (ref 0–35)
AST: 21 U/L (ref 0–37)
Albumin: 4.1 g/dL (ref 3.5–5.2)
Alkaline Phosphatase: 76 U/L (ref 39–117)
BUN: 15 mg/dL (ref 6–23)
CO2: 28 mEq/L (ref 19–32)
Calcium: 9.7 mg/dL (ref 8.4–10.5)
Chloride: 104 mEq/L (ref 96–112)
Creatinine, Ser: 0.9 mg/dL (ref 0.40–1.20)
GFR: 71.2 mL/min (ref >60.00)
Glucose, Bld: 139 mg/dL — ABNORMAL HIGH (ref 70–99)
Potassium: 3.7 mEq/L (ref 3.5–5.1)
Sodium: 140 mEq/L (ref 135–145)
Total Bilirubin: 0.6 mg/dL (ref 0.2–1.2)
Total Protein: 8 g/dL (ref 6.0–8.3)

## 2021-06-28 LAB — LIPID PANEL
Cholesterol: 116 mg/dL (ref 0–200)
HDL: 43.8 mg/dL (ref >39.00)
LDL Cholesterol: 59 mg/dL (ref 0–99)
NonHDL: 72
Total CHOL/HDL Ratio: 3
Triglycerides: 63 mg/dL (ref 0.0–149.0)
VLDL: 12.6 mg/dL (ref 0.0–40.0)

## 2021-06-28 LAB — POCT GLYCOSYLATED HEMOGLOBIN (HGB A1C): Hemoglobin A1C: 7.9 % — AB (ref 4.0–5.6)

## 2021-06-28 MED ORDER — EMPAGLIFLOZIN 25 MG PO TABS
25.0000 mg | ORAL_TABLET | Freq: Every day | ORAL | 1 refills | Status: DC
  Filled 2021-06-28: qty 90, 90d supply, fill #0
  Filled 2021-09-18: qty 90, 90d supply, fill #1

## 2021-06-28 MED ORDER — AMLODIPINE BESYLATE 10 MG PO TABS
10.0000 mg | ORAL_TABLET | Freq: Every day | ORAL | 1 refills | Status: DC
  Filled 2021-06-28: qty 90, 90d supply, fill #0
  Filled 2021-10-12: qty 90, 90d supply, fill #1

## 2021-06-28 NOTE — Assessment & Plan Note (Addendum)
Continue working on lifestyle modifications, continue statin medication.  Pending lipid panel today

## 2021-06-28 NOTE — Assessment & Plan Note (Signed)
Patient's A1c has improved but still not at goal discussed treatment changes will change Jardiance from 10 mg to 25 mg per PCPs last note.  Did discuss with patient should continue checking glucose at home and working on lifestyle modifications.

## 2021-06-28 NOTE — Patient Instructions (Addendum)
Nice to see you today  Stop taking the Jardiance (empagliflozin) 10mg . We are changing it to Jardiance (empagliflozin) 25mg   You can take 2 tablets of the amlodipine 5mg  (total 10mg  daily) until you finish the bottle you have. I will send in a new prescription that will be 10mg  tablets that you will take once a day.  Follow up in 3 months with Dr. Einar Pheasant if she is back or me if she has not returned

## 2021-06-28 NOTE — Assessment & Plan Note (Addendum)
Patient's blood pressure still elevated.  According to last note we will increase amlodipine per primary care provider request.  Did discuss this with patient she can  take 2 tablets of the 5 mg tablets until gone, then start amlodipine 10 mg tablet once daily.  Continue to monitor blood pressure at home.  Also continue working on lifestyle modifications

## 2021-06-28 NOTE — Progress Notes (Signed)
Established Patient Office Visit  Subjective:  Patient ID: Lauren Avila, female    DOB: 1966-02-25  Age: 55 y.o. MRN: [id number]  CC:  Chief Complaint  Patient presents with   Follow-up    3 month f/u    HPI Lauren Avila presents for Recheck  Diabetes: checks her blood glucose daily. As low as 100 and highest 130. Trying to do small meals at closer interverals and having vegs with every meal. As far as exercises states she is walking more and tracking her steps. Joined an exercise class that ended because the teacher.    HTN: Checks BP at home everyday with a wrist cuff. Checked it yesterday and it was 134/84. More in the caution area than in the green  Hyperlipidemia: currently on statin. See above   Past Medical History:  Diagnosis Date   Anxiety    GERD (gastroesophageal reflux disease)    Hypertension    Sleep apnea    cpap    Past Surgical History:  Procedure Laterality Date   CESAREAN SECTION  1991, 2011   2x   INSERTION OF MESH N/A 02/24/2013   Procedure: INSERTION OF MESH;  Surgeon: Adin Hector, MD;  Location: [facility];  Service: General;  Laterality: N/A;   UMBILICAL HERNIA REPAIR N/A 02/24/2013   Procedure: LAPAROSCOPIC REPAIR OF SUPRA UMBILICAL AND UMBILICAL  HERNIAS  AND PLACEMENT OF PAIN PUMP;  Surgeon: Adin Hector, MD;  Location: [facility];  Service: General;  Laterality: N/A;    Family History  Problem Relation Age of Onset   Diabetes Mother    Hypertension Mother     Social History   Socioeconomic History   Marital status: Married    Spouse name: Not on file   Number of children: Not on file   Years of education: Not on file   Highest education level: Not on file  Occupational History   Not on file  Tobacco Use   Smoking status: Never   Smokeless tobacco: Never  Vaping Use   Vaping Use: Never used  Substance and Sexual Activity   Alcohol use: Yes    Comment: very rare   Drug use: No   Sexual activity: Not on file   Other Topics Concern   Not on file  Social History Narrative   Not on file   Social Determinants of Health   Financial Resource Strain: Not on file  Food Insecurity: Not on file  Transportation Needs: Not on file  Physical Activity: Not on file  Stress: Not on file  Social Connections: Not on file  Intimate Partner Violence: Not on file    Outpatient Medications Prior to Visit  Medication Sig Dispense Refill   amLODipine (NORVASC) 5 MG tablet TAKE 1 TABLET BY MOUTH DAILY 90 tablet 3   atorvastatin (LIPITOR) 20 MG tablet TAKE 1 TABLET BY MOUTH ONCE A DAY 90 tablet 3   BIOTIN PO Take by mouth.     COVID-19 At Home Antigen Test (QUICKVUE AT-HOME COVID-19 TEST) KIT Use as directed. 2 each 0   Dulaglutide (TRULICITY) 3 WU/9.8JX SOPN Inject 3 mg into the skin once a week. 2 mL 0   empagliflozin (JARDIANCE) 10 MG TABS tablet Take 1 tablet (10 mg total) by mouth daily before breakfast. 30 tablet 0   glucose blood test strip Use 1 strip daily to check blood sugar 100 each 1   Lancets (FREESTYLE) lancets USE 1 LANCET ONCE DAILY TO CHECK BLOOD SUGAR  100 each 1   lisinopril-hydrochlorothiazide (ZESTORETIC) 10-12.5 MG tablet Take 1 tablet by mouth daily. 90 tablet 0   metFORMIN (GLUCOPHAGE-XR) 500 MG 24 hr tablet TAKE 2 TABLETS BY MOUTH 2 TIMES DAILY WITH A MEAL 360 tablet 1   minoxidil (ROGAINE) 2 % external solution Apply topically 2 (two) times daily.     nystatin-triamcinolone ointment (MYCOLOG) Apply topically 2 (two) times daily.     pantoprazole (PROTONIX) 40 MG tablet TAKE 1 TABLET BY MOUTH DAILY 90 tablet 1   No facility-administered medications prior to visit.    No Known Allergies  ROS Review of Systems  Constitutional:  Negative for chills and fever.  Respiratory:  Negative for cough and shortness of breath.   Cardiovascular:  Negative for chest pain.  Gastrointestinal:  Negative for nausea and vomiting.  Neurological:  Negative for weakness.     Objective:     Physical Exam Vitals and nursing note reviewed.  Constitutional:      Appearance: She is obese.  Eyes:     Extraocular Movements: Extraocular movements intact.  Neck:     Thyroid: No thyroid mass, thyromegaly or thyroid tenderness.     Vascular: No carotid bruit.  Cardiovascular:     Rate and Rhythm: Normal rate and regular rhythm.  Pulmonary:     Effort: Pulmonary effort is normal.     Breath sounds: Normal breath sounds.  Abdominal:     General: Bowel sounds are normal.  Musculoskeletal:     Right lower leg: No edema.     Left lower leg: No edema.  Lymphadenopathy:     Cervical: No cervical adenopathy.  Neurological:     Mental Status: She is alert.  Psychiatric:        Mood and Affect: Mood normal.        Behavior: Behavior normal.        Thought Content: Thought content normal.        Judgment: Judgment normal.    BP (!) 142/86 (BP Location: Left Arm, Patient Position: Sitting, Cuff Size: Large)   Pulse 74   Temp 97.9 F (36.6 C) (Temporal)   Ht 4\' 11"  (1.499 m)   Wt 191 lb 4 oz (86.8 kg)   SpO2 97%   BMI 38.63 kg/m  Wt Readings from Last 3 Encounters:  06/28/21 191 lb 4 oz (86.8 kg)  03/29/21 194 lb 8 oz (88.2 kg)  12/27/20 195 lb 4 oz (88.6 kg)     Health Maintenance Due  Topic Date Due   Zoster Vaccines- Shingrix (1 of 2) Never done   COVID-19 Vaccine (4 - Booster for Pfizer series) 08/12/2020   Pneumococcal Vaccine 41-41 Years old (2 - PCV) 11/24/2020    There are no preventive care reminders to display for this patient.  No results found for: TSH Lab Results  Component Value Date   WBC 6.8 04/10/2017   HGB 13.1 04/10/2017   HCT 39.0 04/10/2017   MCV 82.3 04/10/2017   PLT 357 04/10/2017   Lab Results  Component Value Date   NA 140 01/10/2021   K 3.9 01/10/2021   CO2 30 01/10/2021   GLUCOSE 136 (H) 01/10/2021   BUN 13 01/10/2021   CREATININE 0.78 01/10/2021   BILITOT 0.5 06/22/2020   ALKPHOS 101 06/22/2020   AST 31 06/22/2020   ALT  61 (H) 06/22/2020   PROT 8.4 (H) 06/22/2020   ALBUMIN 4.2 06/22/2020   CALCIUM 9.2 01/10/2021   GFR 84.82 01/10/2021  Lab Results  Component Value Date   CHOL 138 01/10/2021   Lab Results  Component Value Date   HDL 44.80 01/10/2021   Lab Results  Component Value Date   LDLCALC 71 01/10/2021   Lab Results  Component Value Date   TRIG 110.0 01/10/2021   Lab Results  Component Value Date   CHOLHDL 3 01/10/2021   Lab Results  Component Value Date   HGBA1C 8.7 (A) 03/29/2021      Assessment & Plan:   Problem List Items Addressed This Visit       Cardiovascular and Mediastinum   Essential hypertension    Patient's blood pressure still elevated.  According to last note we will increase amlodipine per primary care provider request.  Did discuss this with patient she can  take 2 tablets of the 5 mg tablets until gone, then start amlodipine 10 mg tablet once daily.  Continue to monitor blood pressure at home.  Also continue working on lifestyle modifications      Relevant Medications   amLODipine (NORVASC) 10 MG tablet   Other Relevant Orders   CBC   Comprehensive metabolic panel     Endocrine   Type 2 diabetes mellitus with hyperglycemia, without long-term current use of insulin ([place]) - Primary    Patient's A1c has improved but still not at goal discussed treatment changes will change Jardiance from 10 mg to 25 mg per PCPs last note.  Did discuss with patient should continue checking glucose at home and working on lifestyle modifications.      Relevant Medications   amLODipine (NORVASC) 10 MG tablet   empagliflozin (JARDIANCE) 25 MG TABS tablet   Other Relevant Orders   POCT HgB A1C   CBC   Comprehensive metabolic panel   Lipid panel   Hyperlipidemia associated with type 2 diabetes mellitus ([place])    Continue working on lifestyle modifications, continue statin medication.  Pending lipid panel today      Relevant Medications   amLODipine (NORVASC) 10 MG tablet    empagliflozin (JARDIANCE) 25 MG TABS tablet   Other Relevant Orders   Lipid panel    No orders of the defined types were placed in this encounter.   Follow-up: Return in about 3 months (around 09/26/2021) for Recheck with Dr. Einar Pheasant if she is back with me if she is not returned.   This visit occurred during the SARS-CoV-2 public health emergency.  Safety protocols were in place, including screening questions prior to the visit, additional usage of staff PPE, and extensive cleaning of exam room while observing appropriate contact time as indicated for disinfecting solutions.   Romilda Garret, NP

## 2021-06-29 ENCOUNTER — Other Ambulatory Visit (HOSPITAL_COMMUNITY): Payer: Self-pay

## 2021-07-03 ENCOUNTER — Other Ambulatory Visit: Payer: Self-pay | Admitting: Family Medicine

## 2021-07-03 DIAGNOSIS — I10 Essential (primary) hypertension: Secondary | ICD-10-CM

## 2021-07-05 ENCOUNTER — Other Ambulatory Visit (HOSPITAL_COMMUNITY): Payer: Self-pay

## 2021-07-05 MED ORDER — LISINOPRIL-HYDROCHLOROTHIAZIDE 10-12.5 MG PO TABS
1.0000 | ORAL_TABLET | Freq: Every day | ORAL | 0 refills | Status: DC
  Filled 2021-07-05: qty 90, 90d supply, fill #0

## 2021-07-20 ENCOUNTER — Other Ambulatory Visit (HOSPITAL_COMMUNITY): Payer: Self-pay

## 2021-07-20 ENCOUNTER — Other Ambulatory Visit: Payer: Self-pay | Admitting: Family Medicine

## 2021-07-20 DIAGNOSIS — E1165 Type 2 diabetes mellitus with hyperglycemia: Secondary | ICD-10-CM

## 2021-07-24 MED ORDER — TRULICITY 3 MG/0.5ML ~~LOC~~ SOAJ
3.0000 mg | SUBCUTANEOUS | 1 refills | Status: DC
  Filled 2021-07-24: qty 2, 28d supply, fill #0
  Filled 2021-08-17: qty 2, 28d supply, fill #1

## 2021-07-25 ENCOUNTER — Other Ambulatory Visit (HOSPITAL_COMMUNITY): Payer: Self-pay

## 2021-08-15 ENCOUNTER — Other Ambulatory Visit: Payer: Self-pay

## 2021-08-17 ENCOUNTER — Other Ambulatory Visit: Payer: Self-pay | Admitting: Nurse Practitioner

## 2021-08-17 ENCOUNTER — Other Ambulatory Visit (HOSPITAL_COMMUNITY): Payer: Self-pay

## 2021-08-17 DIAGNOSIS — E1165 Type 2 diabetes mellitus with hyperglycemia: Secondary | ICD-10-CM

## 2021-08-18 ENCOUNTER — Other Ambulatory Visit (HOSPITAL_COMMUNITY): Payer: Self-pay

## 2021-08-18 MED ORDER — ATORVASTATIN CALCIUM 20 MG PO TABS
ORAL_TABLET | Freq: Every day | ORAL | 3 refills | Status: DC
  Filled 2021-08-18: qty 90, 90d supply, fill #0
  Filled 2021-11-28: qty 90, 90d supply, fill #1
  Filled 2022-02-21: qty 90, 90d supply, fill #2
  Filled 2022-05-22: qty 90, 90d supply, fill #3

## 2021-08-21 ENCOUNTER — Other Ambulatory Visit (HOSPITAL_COMMUNITY): Payer: Self-pay

## 2021-08-24 ENCOUNTER — Other Ambulatory Visit (HOSPITAL_COMMUNITY): Payer: Self-pay

## 2021-08-30 ENCOUNTER — Encounter: Payer: Self-pay | Admitting: Nurse Practitioner

## 2021-08-30 NOTE — Telephone Encounter (Signed)
There are notes from [place] office in [facility] section. I did not see an actual sleep study report. Please let patient know how to proceed.

## 2021-08-30 NOTE — Telephone Encounter (Signed)
Christy or Anastasiya,  I need to take a look at the fax from [employer].  She may be managed by them and just needs a signature for new supplies.  She may also need a new sleep study, but I will take a look at the forms first.

## 2021-09-11 DIAGNOSIS — Z01419 Encounter for gynecological examination (general) (routine) without abnormal findings: Secondary | ICD-10-CM | POA: Diagnosis not present

## 2021-09-12 DIAGNOSIS — G4733 Obstructive sleep apnea (adult) (pediatric): Secondary | ICD-10-CM | POA: Diagnosis not present

## 2021-09-18 ENCOUNTER — Other Ambulatory Visit (HOSPITAL_COMMUNITY): Payer: Self-pay

## 2021-09-18 ENCOUNTER — Other Ambulatory Visit: Payer: Self-pay | Admitting: Family Medicine

## 2021-09-18 DIAGNOSIS — I10 Essential (primary) hypertension: Secondary | ICD-10-CM

## 2021-09-18 DIAGNOSIS — E1165 Type 2 diabetes mellitus with hyperglycemia: Secondary | ICD-10-CM

## 2021-09-19 ENCOUNTER — Other Ambulatory Visit (HOSPITAL_COMMUNITY): Payer: Self-pay

## 2021-09-19 MED ORDER — LISINOPRIL-HYDROCHLOROTHIAZIDE 10-12.5 MG PO TABS
1.0000 | ORAL_TABLET | Freq: Every day | ORAL | 0 refills | Status: DC
  Filled 2021-09-19: qty 90, 90d supply, fill #0

## 2021-09-19 MED ORDER — TRULICITY 3 MG/0.5ML ~~LOC~~ SOAJ
3.0000 mg | SUBCUTANEOUS | 1 refills | Status: DC
  Filled 2021-09-19: qty 2, 28d supply, fill #0
  Filled 2021-10-12: qty 2, 28d supply, fill #1

## 2021-09-28 ENCOUNTER — Ambulatory Visit (INDEPENDENT_AMBULATORY_CARE_PROVIDER_SITE_OTHER): Payer: 59 | Admitting: Family Medicine

## 2021-09-28 ENCOUNTER — Other Ambulatory Visit (HOSPITAL_COMMUNITY): Payer: Self-pay

## 2021-09-28 ENCOUNTER — Other Ambulatory Visit: Payer: Self-pay

## 2021-09-28 VITALS — BP 122/80 | HR 93 | Temp 98.9°F | Ht 59.0 in | Wt 187.2 lb

## 2021-09-28 DIAGNOSIS — I10 Essential (primary) hypertension: Secondary | ICD-10-CM | POA: Diagnosis not present

## 2021-09-28 DIAGNOSIS — E1165 Type 2 diabetes mellitus with hyperglycemia: Secondary | ICD-10-CM | POA: Diagnosis not present

## 2021-09-28 LAB — POCT GLYCOSYLATED HEMOGLOBIN (HGB A1C): Hemoglobin A1C: 7.4 % — AB (ref 4.0–5.6)

## 2021-09-28 MED ORDER — METFORMIN HCL ER 500 MG PO TB24
ORAL_TABLET | ORAL | 3 refills | Status: DC
  Filled 2021-09-28 – 2021-09-30 (×2): qty 360, 90d supply, fill #0
  Filled 2022-01-22: qty 360, 90d supply, fill #1
  Filled 2022-04-22: qty 360, 90d supply, fill #2

## 2021-09-28 MED ORDER — EMPAGLIFLOZIN 25 MG PO TABS
25.0000 mg | ORAL_TABLET | Freq: Every day | ORAL | 3 refills | Status: DC
  Filled 2021-09-28 – 2021-12-19 (×2): qty 90, 90d supply, fill #0
  Filled 2022-03-19: qty 30, 30d supply, fill #1
  Filled 2022-04-22: qty 30, 30d supply, fill #2
  Filled 2022-05-22: qty 30, 30d supply, fill #3

## 2021-09-28 NOTE — Assessment & Plan Note (Signed)
BP at goal.  Continue amlodipine 10 mg and lisinopril hydrochlorothiazide 10-12.5.  ?

## 2021-09-28 NOTE — Progress Notes (Signed)
? ?Subjective:  ? ?  ?Lauren Avila is a 56 y.o. female presenting for Follow-up (3 mo- DM ) ?  ? ? ?HPI ? ?#Diabetes ?Currently taking metformin and jardiance  ?Using medications without difficulties: No ?Hypoglycemic episodes:No  ?Hyperglycemic episodes:No  ?Feet problems:No  ?Blood Sugars averaging: less than 121 ?Last HgbA1c:  ?Lab Results  ?Component Value Date  ? HGBA1C 7.4 (A) 09/28/2021  ? ?Taking metformin XR 1000 mg BID ?Watching what she eats ? ?Diabetes Health Maintenance Due:  ?  ?Diabetes Health Maintenance Due  ?Topic Date Due  ? OPHTHALMOLOGY EXAM  12/13/2021  ? FOOT EXAM  12/27/2021  ? HEMOGLOBIN A1C  03/31/2022  ? ?#HTN ?- amlodipine 10 mg and lisinopril-hctz ?- some mild swelling occasionally after work ?- getting some frontal tension HA - occasionally - normal blood pressure during  ?-  ? ?Review of Systems ? ? ?Social History  ? ?Tobacco Use  ?Smoking Status Never  ?Smokeless Tobacco Never  ? ? ? ?   ?Objective:  ?  ?BP Readings from Last 3 Encounters:  ?09/28/21 122/80  ?06/28/21 (!) 142/86  ?03/29/21 (!) 146/90  ? ?Wt Readings from Last 3 Encounters:  ?09/28/21 187 lb 3 oz (84.9 kg)  ?06/28/21 191 lb 4 oz (86.8 kg)  ?03/29/21 194 lb 8 oz (88.2 kg)  ? ? ?BP 122/80   Pulse 93   Temp 98.9 ?F (37.2 ?C) (Oral)   Ht 4\' 11"  (1.499 m)   Wt 187 lb 3 oz (84.9 kg)   SpO2 96%   BMI 37.81 kg/m?  ? ? ?Physical Exam ?Constitutional:   ?   General: She is not in acute distress. ?   Appearance: She is well-developed. She is not diaphoretic.  ?HENT:  ?   Right Ear: External ear normal.  ?   Left Ear: External ear normal.  ?Eyes:  ?   Conjunctiva/sclera: Conjunctivae normal.  ?Cardiovascular:  ?   Rate and Rhythm: Normal rate and regular rhythm.  ?   Heart sounds: No murmur heard. ?Pulmonary:  ?   Effort: Pulmonary effort is normal. No respiratory distress.  ?   Breath sounds: Normal breath sounds. No wheezing.  ?Musculoskeletal:  ?   Cervical back: Neck supple.  ?Skin: ?   General: Skin is warm  and dry.  ?   Capillary Refill: Capillary refill takes less than 2 seconds.  ?Neurological:  ?   Mental Status: She is alert. Mental status is at baseline.  ?Psychiatric:     ?   Mood and Affect: Mood normal.     ?   Behavior: Behavior normal.  ? ? ? ? ? ?   ?Assessment & Plan:  ? ?Problem List Items Addressed This Visit   ? ?  ? Cardiovascular and Mediastinum  ? Essential hypertension - Primary  ?  BP at goal.  Continue amlodipine 10 mg and lisinopril hydrochlorothiazide 10-12.5.  ?  ?  ?  ? Endocrine  ? Type 2 diabetes mellitus with hyperglycemia, without long-term current use of insulin ([place])  ?  Lab Results  ?Component Value Date  ? HGBA1C 7.4 (A) 09/28/2021  ?Improved from prior.  Discussed option of increasing Trulicity to reach goal of less than 7 versus continuing to work on diet and exercise.  She would like to continue current medications at dosages and will follow-up in 4 months.  Continue Trulicity 3 mg, Jardiance 25 mg, metformin XR 1000 twice a day ?  ?  ? Relevant Medications  ?  empagliflozin (JARDIANCE) 25 MG TABS tablet  ? metFORMIN (GLUCOPHAGE-XR) 500 MG 24 hr tablet  ? Other Relevant Orders  ? POCT glycosylated hemoglobin (Hb A1C) (Completed)  ? ? ? ?Return in about 4 months (around 01/28/2022) for physical and Diabetes f/u. ? ?Lesleigh Noe, MD ? ?This visit occurred during the SARS-CoV-2 public health emergency.  Safety protocols were in place, including screening questions prior to the visit, additional usage of staff PPE, and extensive cleaning of exam room while observing appropriate contact time as indicated for disinfecting solutions.  ? ?

## 2021-09-28 NOTE — Patient Instructions (Signed)
Continue your current medications.

## 2021-09-28 NOTE — Assessment & Plan Note (Signed)
Lab Results  ?Component Value Date  ? HGBA1C 7.4 (A) 09/28/2021  ? ?Improved from prior.  Discussed option of increasing Trulicity to reach goal of less than 7 versus continuing to work on diet and exercise.  She would like to continue current medications at dosages and will follow-up in 4 months.  Continue Trulicity 3 mg, Jardiance 25 mg, metformin XR 1000 twice a day ?

## 2021-09-29 ENCOUNTER — Other Ambulatory Visit (HOSPITAL_COMMUNITY): Payer: Self-pay

## 2021-09-30 ENCOUNTER — Other Ambulatory Visit (HOSPITAL_COMMUNITY): Payer: Self-pay

## 2021-10-05 ENCOUNTER — Other Ambulatory Visit: Payer: Self-pay | Admitting: Family Medicine

## 2021-10-05 DIAGNOSIS — Z1231 Encounter for screening mammogram for malignant neoplasm of breast: Secondary | ICD-10-CM

## 2021-10-10 DIAGNOSIS — G4733 Obstructive sleep apnea (adult) (pediatric): Secondary | ICD-10-CM | POA: Diagnosis not present

## 2021-10-12 ENCOUNTER — Other Ambulatory Visit (HOSPITAL_COMMUNITY): Payer: Self-pay

## 2021-10-31 ENCOUNTER — Other Ambulatory Visit (HOSPITAL_COMMUNITY): Payer: Self-pay

## 2021-11-10 DIAGNOSIS — G4733 Obstructive sleep apnea (adult) (pediatric): Secondary | ICD-10-CM | POA: Diagnosis not present

## 2021-11-14 ENCOUNTER — Other Ambulatory Visit: Payer: Self-pay | Admitting: Family Medicine

## 2021-11-14 DIAGNOSIS — E1165 Type 2 diabetes mellitus with hyperglycemia: Secondary | ICD-10-CM

## 2021-11-15 ENCOUNTER — Other Ambulatory Visit (HOSPITAL_COMMUNITY): Payer: Self-pay

## 2021-11-15 MED ORDER — TRULICITY 3 MG/0.5ML ~~LOC~~ SOAJ
3.0000 mg | SUBCUTANEOUS | 2 refills | Status: DC
  Filled 2021-11-15: qty 2, 28d supply, fill #0
  Filled 2021-12-08: qty 2, 28d supply, fill #1
  Filled 2022-01-05: qty 2, 28d supply, fill #2

## 2021-11-20 ENCOUNTER — Ambulatory Visit
Admission: RE | Admit: 2021-11-20 | Discharge: 2021-11-20 | Disposition: A | Discharge status: Home / Self Care | Payer: 59 | Source: Ambulatory Visit | Attending: Family Medicine | Admitting: Family Medicine

## 2021-11-20 DIAGNOSIS — Z1231 Encounter for screening mammogram for malignant neoplasm of breast: Secondary | ICD-10-CM

## 2021-11-21 ENCOUNTER — Other Ambulatory Visit: Payer: Self-pay | Admitting: Family Medicine

## 2021-11-21 DIAGNOSIS — R928 Other abnormal and inconclusive findings on diagnostic imaging of breast: Secondary | ICD-10-CM

## 2021-11-28 ENCOUNTER — Other Ambulatory Visit (HOSPITAL_COMMUNITY): Payer: Self-pay

## 2021-12-04 ENCOUNTER — Ambulatory Visit
Admission: RE | Admit: 2021-12-04 | Discharge: 2021-12-04 | Disposition: A | Discharge status: Home / Self Care | Payer: 59 | Source: Ambulatory Visit | Attending: Family Medicine | Admitting: Family Medicine

## 2021-12-04 ENCOUNTER — Other Ambulatory Visit: Payer: Self-pay | Admitting: Family Medicine

## 2021-12-04 DIAGNOSIS — R928 Other abnormal and inconclusive findings on diagnostic imaging of breast: Secondary | ICD-10-CM

## 2021-12-04 DIAGNOSIS — N6312 Unspecified lump in the right breast, upper inner quadrant: Secondary | ICD-10-CM | POA: Diagnosis not present

## 2021-12-04 DIAGNOSIS — R922 Inconclusive mammogram: Secondary | ICD-10-CM | POA: Diagnosis not present

## 2021-12-04 DIAGNOSIS — N6001 Solitary cyst of right breast: Secondary | ICD-10-CM

## 2021-12-04 DIAGNOSIS — N6314 Unspecified lump in the right breast, lower inner quadrant: Secondary | ICD-10-CM | POA: Diagnosis not present

## 2021-12-09 ENCOUNTER — Other Ambulatory Visit (HOSPITAL_COMMUNITY): Payer: Self-pay

## 2021-12-10 DIAGNOSIS — G4733 Obstructive sleep apnea (adult) (pediatric): Secondary | ICD-10-CM | POA: Diagnosis not present

## 2021-12-14 ENCOUNTER — Encounter: Payer: Self-pay | Admitting: Family Medicine

## 2021-12-19 ENCOUNTER — Other Ambulatory Visit: Payer: Self-pay | Admitting: Family Medicine

## 2021-12-19 ENCOUNTER — Other Ambulatory Visit (HOSPITAL_COMMUNITY): Payer: Self-pay

## 2021-12-19 DIAGNOSIS — I10 Essential (primary) hypertension: Secondary | ICD-10-CM

## 2021-12-20 ENCOUNTER — Other Ambulatory Visit (HOSPITAL_COMMUNITY): Payer: Self-pay

## 2021-12-20 LAB — HM DIABETES EYE EXAM

## 2021-12-20 MED ORDER — LISINOPRIL-HYDROCHLOROTHIAZIDE 10-12.5 MG PO TABS
1.0000 | ORAL_TABLET | Freq: Every day | ORAL | 0 refills | Status: DC
  Filled 2021-12-20: qty 90, 90d supply, fill #0

## 2022-01-05 ENCOUNTER — Other Ambulatory Visit: Payer: Self-pay | Admitting: Family Medicine

## 2022-01-05 ENCOUNTER — Other Ambulatory Visit: Payer: Self-pay | Admitting: Nurse Practitioner

## 2022-01-05 ENCOUNTER — Other Ambulatory Visit (HOSPITAL_COMMUNITY): Payer: Self-pay

## 2022-01-05 DIAGNOSIS — E1165 Type 2 diabetes mellitus with hyperglycemia: Secondary | ICD-10-CM

## 2022-01-05 DIAGNOSIS — I10 Essential (primary) hypertension: Secondary | ICD-10-CM

## 2022-01-06 ENCOUNTER — Other Ambulatory Visit (HOSPITAL_COMMUNITY): Payer: Self-pay

## 2022-01-06 MED ORDER — AMLODIPINE BESYLATE 10 MG PO TABS
10.0000 mg | ORAL_TABLET | Freq: Every day | ORAL | 1 refills | Status: DC
  Filled 2022-01-06: qty 90, 90d supply, fill #0
  Filled 2022-04-07: qty 90, 90d supply, fill #1

## 2022-01-09 ENCOUNTER — Other Ambulatory Visit (HOSPITAL_COMMUNITY): Payer: Self-pay

## 2022-01-10 ENCOUNTER — Other Ambulatory Visit (HOSPITAL_COMMUNITY): Payer: Self-pay

## 2022-01-10 DIAGNOSIS — G4733 Obstructive sleep apnea (adult) (pediatric): Secondary | ICD-10-CM | POA: Diagnosis not present

## 2022-01-22 ENCOUNTER — Other Ambulatory Visit (HOSPITAL_COMMUNITY): Payer: Self-pay

## 2022-01-22 ENCOUNTER — Other Ambulatory Visit: Payer: Self-pay | Admitting: Family Medicine

## 2022-01-22 DIAGNOSIS — K219 Gastro-esophageal reflux disease without esophagitis: Secondary | ICD-10-CM

## 2022-01-22 MED ORDER — PANTOPRAZOLE SODIUM 40 MG PO TBEC
DELAYED_RELEASE_TABLET | Freq: Every day | ORAL | 0 refills | Status: DC
  Filled 2022-01-22: qty 90, 90d supply, fill #0

## 2022-02-08 ENCOUNTER — Other Ambulatory Visit (HOSPITAL_COMMUNITY): Payer: Self-pay

## 2022-02-08 ENCOUNTER — Encounter: Payer: Self-pay | Admitting: Family Medicine

## 2022-02-08 ENCOUNTER — Ambulatory Visit: Payer: 59 | Admitting: Family Medicine

## 2022-02-08 VITALS — BP 110/78 | HR 74 | Temp 97.8°F | Ht 59.5 in | Wt 185.4 lb

## 2022-02-08 DIAGNOSIS — E1165 Type 2 diabetes mellitus with hyperglycemia: Secondary | ICD-10-CM | POA: Diagnosis not present

## 2022-02-08 DIAGNOSIS — E1169 Type 2 diabetes mellitus with other specified complication: Secondary | ICD-10-CM | POA: Diagnosis not present

## 2022-02-08 DIAGNOSIS — E785 Hyperlipidemia, unspecified: Secondary | ICD-10-CM

## 2022-02-08 DIAGNOSIS — I10 Essential (primary) hypertension: Secondary | ICD-10-CM | POA: Diagnosis not present

## 2022-02-08 DIAGNOSIS — Z0000 Encounter for general adult medical examination without abnormal findings: Secondary | ICD-10-CM | POA: Diagnosis not present

## 2022-02-08 LAB — COMPREHENSIVE METABOLIC PANEL
ALT: 24 U/L (ref 0–35)
AST: 17 U/L (ref 0–37)
Albumin: 4 g/dL (ref 3.5–5.2)
Alkaline Phosphatase: 93 U/L (ref 39–117)
BUN: 15 mg/dL (ref 6–23)
CO2: 26 mEq/L (ref 19–32)
Calcium: 9.5 mg/dL (ref 8.4–10.5)
Chloride: 104 mEq/L (ref 96–112)
Creatinine, Ser: 0.82 mg/dL (ref 0.40–1.20)
GFR: 79.28 mL/min (ref >60.00)
Glucose, Bld: 115 mg/dL — ABNORMAL HIGH (ref 70–99)
Potassium: 3.6 mEq/L (ref 3.5–5.1)
Sodium: 138 mEq/L (ref 135–145)
Total Bilirubin: 0.5 mg/dL (ref 0.2–1.2)
Total Protein: 7.8 g/dL (ref 6.0–8.3)

## 2022-02-08 LAB — LIPID PANEL
Cholesterol: 110 mg/dL (ref 0–200)
HDL: 50.1 mg/dL (ref >39.00)
LDL Cholesterol: 46 mg/dL (ref 0–99)
NonHDL: 59.8
Total CHOL/HDL Ratio: 2
Triglycerides: 67 mg/dL (ref 0.0–149.0)
VLDL: 13.4 mg/dL (ref 0.0–40.0)

## 2022-02-08 LAB — HEMOGLOBIN A1C: Hgb A1c MFr Bld: 7.2 % — ABNORMAL HIGH (ref 4.6–6.5)

## 2022-02-08 MED ORDER — TRULICITY 3 MG/0.5ML ~~LOC~~ SOAJ
3.0000 mg | SUBCUTANEOUS | 2 refills | Status: DC
  Filled 2022-02-08: qty 2, 28d supply, fill #0
  Filled 2022-02-08 – 2022-03-05 (×2): qty 2, 28d supply, fill #1
  Filled 2022-04-07: qty 2, 28d supply, fill #2

## 2022-02-08 MED ORDER — LISINOPRIL-HYDROCHLOROTHIAZIDE 10-12.5 MG PO TABS
1.0000 | ORAL_TABLET | Freq: Every day | ORAL | 0 refills | Status: DC
  Filled 2022-02-08 – 2022-04-03 (×2): qty 90, 90d supply, fill #0

## 2022-02-08 NOTE — Progress Notes (Signed)
Annual Exam   Chief Complaint:  Chief Complaint  Patient presents with   Annual Exam    History of Present Illness:  [name] is a 56 y.o. No obstetric history on file. who LMP was No LMP recorded. Patient is postmenopausal., presents today for her annual examination.     Nutrition She does get adequate calcium and Vitamin D in her diet. Diet: diabetes, CBG 99-122 Exercise: hiking weekly with church, walking  Recently retired - lower stress  Social History   Tobacco Use  Smoking Status Never  Smokeless Tobacco Never   Social History   Substance and Sexual Activity  Alcohol Use Yes   Comment: very rare   Social History   Substance and Sexual Activity  Drug Use No     General Health Dentist in the last year: Yes Eye doctor: yes  Safety The patient wears seatbelts: yes.     The patient feels safe at home and in their relationships: yes.   Menstrual:  Symptoms of menopause: hot flashes which are improving  GYN She is single partner, contraception - post menopausal status.    Cervical Cancer Screening (21-65):   Last Pap:  had one more recently 2023- will request records  Breast Cancer Screening (Age 50-74):  There is no FH of breast cancer. There is no FH of ovarian cancer. BRCA screening Not Indicated.  Last Mammogram: 11/2021 The patient does want a mammogram this year.    Colon Cancer Screening:  Age 35-75 yo - benefits outweigh the risk. Adults 62-85 yo who have never been screened benefit.  Benefits: 134000 people in 2016 will be diagnosed and 49,000 will die - early detection helps [place]: Complications 2/2 to colonoscopy High Risk (Colonoscopy): genetic disorder (Lynch syndrome or familial adenomatous polyposis), personal hx of IBD, previous adenomatous polyp, or previous colorectal cancer, FamHx start 10 years before the age at diagnosis, increased in males and black race  Options:  FIT - looks for hemoglobin (blood in the stool) -  specific and fairly sensitive - must be done annually Cologuard - looks for DNA and blood - more sensitive - therefore can have more false positives, every 3 years Colonoscopy - every 10 years if normal - sedation, bowl prep, must have someone drive you  Shared decision making and the patient had decided to do colonoscopy 2027.   Social History   Tobacco Use  Smoking Status Never  Smokeless Tobacco Never    Lung Cancer Screening (Ages 39-03): not applicable   Weight Wt Readings from Last 3 Encounters:  02/08/22 185 lb 6 oz (84.1 kg)  09/28/21 187 lb 3 oz (84.9 kg)  06/28/21 191 lb 4 oz (86.8 kg)   Patient has high BMI  BMI Readings from Last 1 Encounters:  02/08/22 36.81 kg/m     Chronic disease screening Blood pressure monitoring:  BP Readings from Last 3 Encounters:  02/08/22 110/78  09/28/21 122/80  06/28/21 (!) 142/86    Lipid Monitoring: Indication for screening: age >81, obesity, diabetes, family hx, CV risk factors.  Lipid screening: Yes  Lab Results  Component Value Date   CHOL 116 06/28/2021   HDL 43.80 06/28/2021   LDLCALC 59 06/28/2021   TRIG 63.0 06/28/2021   CHOLHDL 3 06/28/2021     Diabetes Screening: age >67, overweight, family hx, PCOS, hx of gestational diabetes, at risk ethnicity Diabetes Screening screening: Yes  Lab Results  Component Value Date   HGBA1C 7.4 (A) 09/28/2021  Past Medical History:  Diagnosis Date   Anxiety    GERD (gastroesophageal reflux disease)    Hypertension    Sleep apnea    cpap    Past Surgical History:  Procedure Laterality Date   CESAREAN SECTION  1991, 2011   2x   INSERTION OF MESH N/A 02/24/2013   Procedure: INSERTION OF MESH;  Surgeon: Adin Hector, MD;  Location: [facility];  Service: General;  Laterality: N/A;   UMBILICAL HERNIA REPAIR N/A 02/24/2013   Procedure: LAPAROSCOPIC REPAIR OF SUPRA UMBILICAL AND UMBILICAL  HERNIAS  AND PLACEMENT OF PAIN PUMP;  Surgeon: Adin Hector, MD;   Location: [facility];  Service: General;  Laterality: N/A;    Prior to Admission medications   Medication Sig Start Date End Date Taking? Authorizing Provider  amLODipine (NORVASC) 10 MG tablet Take 1 tablet (10 mg total) by mouth daily. 01/06/22  Yes Dutch Quint B, FNP  atorvastatin (LIPITOR) 20 MG tablet TAKE 1 TABLET BY MOUTH ONCE A DAY 08/18/21 08/18/22 Yes Michela Pitcher, NP  BIOTIN PO Take by mouth.   Yes [provider]  Dulaglutide (TRULICITY) 3 MG/5.0IB SOPN Inject 3 mg into the skin once a week. 11/15/21  Yes Lesleigh Noe, MD  empagliflozin (JARDIANCE) 25 MG TABS tablet Take 1 tablet (25 mg total) by mouth daily before breakfast. 09/28/21  Yes Lesleigh Noe, MD  glucose blood test strip Use 1 strip daily to check blood sugar 12/27/20  Yes Lesleigh Noe, MD  Lancets (FREESTYLE) lancets USE 1 LANCET ONCE DAILY TO CHECK BLOOD SUGAR 12/27/20  Yes Lesleigh Noe, MD  lisinopril-hydrochlorothiazide (ZESTORETIC) 10-12.5 MG tablet Take 1 tablet by mouth daily. 12/20/21  Yes Lesleigh Noe, MD  metFORMIN (GLUCOPHAGE-XR) 500 MG 24 hr tablet TAKE 2 TABLETS BY MOUTH 2 TIMES DAILY WITH A MEAL 09/28/21 09/28/22 Yes Lesleigh Noe, MD  minoxidil (ROGAINE) 2 % external solution Apply topically 2 (two) times daily.   Yes [provider]  nystatin-triamcinolone ointment (MYCOLOG) Apply topically 2 (two) times daily.   Yes [provider]  pantoprazole (PROTONIX) 40 MG tablet TAKE 1 TABLET BY MOUTH DAILY 01/22/22 01/22/23 Yes Lesleigh Noe, MD    No Known Allergies  Gynecologic History: No LMP recorded. Patient is postmenopausal.  Obstetric History: No obstetric history on file.  Social History   Socioeconomic History   Marital status: Married    Spouse name: Not on file   Number of children: Not on file   Years of education: Not on file   Highest education level: Not on file  Occupational History   Not on file  Tobacco Use   Smoking status: Never   Smokeless  tobacco: Never  Vaping Use   Vaping Use: Never used  Substance and Sexual Activity   Alcohol use: Yes    Comment: very rare   Drug use: No   Sexual activity: Not on file  Other Topics Concern   Not on file  Social History Narrative   Not on file   Social Determinants of Health   Financial Resource Strain: Not on file  Food Insecurity: Not on file  Transportation Needs: Not on file  Physical Activity: Not on file  Stress: Not on file  Social Connections: Not on file  Intimate Partner Violence: Not on file    Family History  Problem Relation Age of Onset   Diabetes Mother    Hypertension Mother     Review of Systems  Constitutional:  Negative for chills and fever.  HENT:  Negative for congestion and sore throat.   Eyes:  Negative for blurred vision and double vision.  Respiratory:  Negative for shortness of breath.   Cardiovascular:  Negative for chest pain.  Gastrointestinal:  Negative for heartburn, nausea and vomiting.  Genitourinary: Negative.   Musculoskeletal: Negative.  Negative for myalgias.  Skin:  Negative for rash.  Neurological:  Negative for dizziness and headaches.  Endo/Heme/Allergies:  Does not bruise/bleed easily.  Psychiatric/Behavioral:  Negative for depression. The patient is not nervous/anxious.      Physical Exam BP 110/78   Pulse 74   Temp 97.8 F (36.6 C) (Temporal)   Ht 4' 11.5" (1.511 m)   Wt 185 lb 6 oz (84.1 kg)   SpO2 98%   BMI 36.81 kg/m    BP Readings from Last 3 Encounters:  02/08/22 110/78  09/28/21 122/80  06/28/21 (!) 142/86      Physical Exam Constitutional:      General: She is not in acute distress.    Appearance: She is well-developed. She is not diaphoretic.  HENT:     Head: Normocephalic and atraumatic.     Right Ear: External ear normal.     Left Ear: External ear normal.     Nose: Nose normal.  Eyes:     General: No scleral icterus.    Extraocular Movements: Extraocular movements intact.      Conjunctiva/sclera: Conjunctivae normal.  Cardiovascular:     Rate and Rhythm: Normal rate and regular rhythm.     Heart sounds: No murmur heard. Pulmonary:     Effort: Pulmonary effort is normal. No respiratory distress.     Breath sounds: Normal breath sounds. No wheezing.  Abdominal:     General: Bowel sounds are normal. There is no distension.     Palpations: Abdomen is soft. There is no mass.     Tenderness: There is no abdominal tenderness. There is no guarding or rebound.  Musculoskeletal:        General: Normal range of motion.     Cervical back: Neck supple.  Lymphadenopathy:     Cervical: No cervical adenopathy.  Skin:    General: Skin is warm and dry.     Capillary Refill: Capillary refill takes less than 2 seconds.  Neurological:     Mental Status: She is alert and oriented to person, place, and time.     Deep Tendon Reflexes: Reflexes normal.  Psychiatric:        Mood and Affect: Mood normal.        Behavior: Behavior normal.     Results:  PHQ-9:     02/08/2022    8:45 AM 12/27/2020    4:28 PM 10/23/2019    8:12 AM  Depression screen PHQ 2/9  Decreased Interest 0 0 0  Down, Depressed, Hopeless 0 0 0  PHQ - 2 Score 0 0 0       Assessment: 56 y.o. No obstetric history on file. female here for routine annual physical examination.  Plan: Problem List Items Addressed This Visit       Cardiovascular and Mediastinum   Essential hypertension   Relevant Medications   lisinopril-hydrochlorothiazide (ZESTORETIC) 10-12.5 MG tablet   Other Relevant Orders   Comprehensive metabolic panel     Endocrine   Type 2 diabetes mellitus with hyperglycemia, without long-term current use of insulin (HCC)   Relevant Medications   Dulaglutide (TRULICITY) 3 MG/0.5ML SOPN  lisinopril-hydrochlorothiazide (ZESTORETIC) 10-12.5 MG tablet   Other Relevant Orders   Hemoglobin A1c   Hyperlipidemia associated with type 2 diabetes mellitus (HCC)   Relevant Medications    Dulaglutide (TRULICITY) 3 PW/3.4KM SOPN   lisinopril-hydrochlorothiazide (ZESTORETIC) 10-12.5 MG tablet   Other Relevant Orders   Lipid panel   Other Visit Diagnoses     Annual physical exam    -  Primary       Screening: -- Blood pressure screen normal -- cholesterol screening: will obtain -- Weight screening: overweight: continue to monitor -- Diabetes Screening: will obtain -- Nutrition: Encouraged healthy diet  The ASCVD Risk score (Arnett DK, et al., 2019) failed to calculate for the following reasons:   The valid total cholesterol range is 130 to 320 mg/dL  -- Statin therapy for Age 2-75 with CVD risk >7.5%  Psych -- Depression screening (PHQ-9):     Safety -- tobacco screening: not using -- alcohol screening:  low-risk usage. -- no evidence of domestic violence or intimate partner violence.   Cancer Screening -- pap smear not collected per ASCCP guidelines -- family history of breast cancer screening: done. not at high risk. -- Mammogram -  up to date -- Colon cancer (age 52+)--  up to date  Immunizations Immunization History  Administered Date(s) Administered   Influenza-Unspecified 04/29/2020, 05/08/2021   PFIZER(Purple Top)SARS-COV-2 Vaccination 08/07/2019, 08/28/2019, 06/17/2020   Pneumococcal Polysaccharide-23 11/25/2019   Tdap 02/05/2014    -- flu vaccine up to date -- TDAP q10 years up to date -- Shingles (age >43) not up to date - encouraged getting -- PPSV-23 (19-64 with chronic disease or smoking) up to date  -- Covid-19 Vaccine up to date   Encouraged healthy diet and exercise. Encouraged regular vision and dental care.    Lesleigh Noe, MD

## 2022-02-09 ENCOUNTER — Encounter: Payer: Self-pay | Admitting: Family Medicine

## 2022-02-09 DIAGNOSIS — G4733 Obstructive sleep apnea (adult) (pediatric): Secondary | ICD-10-CM | POA: Diagnosis not present

## 2022-02-21 ENCOUNTER — Other Ambulatory Visit (HOSPITAL_COMMUNITY): Payer: Self-pay

## 2022-03-05 ENCOUNTER — Other Ambulatory Visit (HOSPITAL_COMMUNITY): Payer: Self-pay

## 2022-03-12 ENCOUNTER — Other Ambulatory Visit (HOSPITAL_COMMUNITY): Payer: Self-pay

## 2022-03-13 ENCOUNTER — Other Ambulatory Visit (HOSPITAL_COMMUNITY): Payer: Self-pay

## 2022-03-16 ENCOUNTER — Encounter: Payer: Self-pay | Admitting: Family Medicine

## 2022-03-16 ENCOUNTER — Other Ambulatory Visit (HOSPITAL_COMMUNITY): Payer: Self-pay

## 2022-03-16 ENCOUNTER — Telehealth: Payer: Self-pay | Admitting: Family Medicine

## 2022-03-16 NOTE — Telephone Encounter (Signed)
Patient called and stated a fax should have been sent from [facility] about medication Trullicity '3mg'$  prior prescription authorization. Call back number [phone]

## 2022-03-19 ENCOUNTER — Telehealth: Payer: Self-pay

## 2022-03-19 ENCOUNTER — Other Ambulatory Visit (HOSPITAL_COMMUNITY): Payer: Self-pay

## 2022-03-19 NOTE — Telephone Encounter (Signed)
Prior auth started and approved for Trulicity '3MG'$ /0.5ML pen-injectors. [place] Sep Key: [id number] - [place] Case ID: [id number] Approved today Case [id number];QSYHNP:MVAEPNTB;Review Type:Prior Auth;Coverage Start Date:02/17/2022;Coverage End Date:07/29/2098;  Notified patient via mychart.

## 2022-04-03 ENCOUNTER — Other Ambulatory Visit (HOSPITAL_COMMUNITY): Payer: Self-pay

## 2022-04-07 ENCOUNTER — Other Ambulatory Visit (HOSPITAL_COMMUNITY): Payer: Self-pay

## 2022-04-09 ENCOUNTER — Other Ambulatory Visit (HOSPITAL_COMMUNITY): Payer: Self-pay

## 2022-04-22 ENCOUNTER — Other Ambulatory Visit: Payer: Self-pay | Admitting: Family Medicine

## 2022-04-22 DIAGNOSIS — K219 Gastro-esophageal reflux disease without esophagitis: Secondary | ICD-10-CM

## 2022-04-23 ENCOUNTER — Other Ambulatory Visit (HOSPITAL_COMMUNITY): Payer: Self-pay

## 2022-04-23 MED ORDER — PANTOPRAZOLE SODIUM 40 MG PO TBEC
40.0000 mg | DELAYED_RELEASE_TABLET | Freq: Every day | ORAL | 0 refills | Status: DC
  Filled 2022-04-23: qty 90, 90d supply, fill #0

## 2022-04-24 ENCOUNTER — Other Ambulatory Visit (HOSPITAL_COMMUNITY): Payer: Self-pay

## 2022-05-11 ENCOUNTER — Ambulatory Visit: Payer: 59 | Admitting: Family Medicine

## 2022-05-11 ENCOUNTER — Ambulatory Visit: Admitting: Family

## 2022-05-11 ENCOUNTER — Encounter: Payer: Self-pay | Admitting: Family

## 2022-05-11 VITALS — BP 116/70 | HR 87 | Temp 98.6°F | Resp 16 | Ht 59.5 in | Wt 186.4 lb

## 2022-05-11 DIAGNOSIS — K219 Gastro-esophageal reflux disease without esophagitis: Secondary | ICD-10-CM

## 2022-05-11 DIAGNOSIS — G4733 Obstructive sleep apnea (adult) (pediatric): Secondary | ICD-10-CM

## 2022-05-11 DIAGNOSIS — L659 Nonscarring hair loss, unspecified: Secondary | ICD-10-CM | POA: Diagnosis not present

## 2022-05-11 DIAGNOSIS — M2550 Pain in unspecified joint: Secondary | ICD-10-CM | POA: Diagnosis not present

## 2022-05-11 DIAGNOSIS — E669 Obesity, unspecified: Secondary | ICD-10-CM

## 2022-05-11 DIAGNOSIS — E1165 Type 2 diabetes mellitus with hyperglycemia: Secondary | ICD-10-CM | POA: Diagnosis not present

## 2022-05-11 DIAGNOSIS — Z1283 Encounter for screening for malignant neoplasm of skin: Secondary | ICD-10-CM

## 2022-05-11 DIAGNOSIS — E1169 Type 2 diabetes mellitus with other specified complication: Secondary | ICD-10-CM

## 2022-05-11 DIAGNOSIS — Z1389 Encounter for screening for other disorder: Secondary | ICD-10-CM

## 2022-05-11 DIAGNOSIS — E559 Vitamin D deficiency, unspecified: Secondary | ICD-10-CM | POA: Diagnosis not present

## 2022-05-11 DIAGNOSIS — E785 Hyperlipidemia, unspecified: Secondary | ICD-10-CM

## 2022-05-11 DIAGNOSIS — G6289 Other specified polyneuropathies: Secondary | ICD-10-CM | POA: Diagnosis not present

## 2022-05-11 DIAGNOSIS — I10 Essential (primary) hypertension: Secondary | ICD-10-CM

## 2022-05-11 LAB — B12 AND FOLATE PANEL
Folate: 8.4 ng/mL (ref >5.9)
Vitamin B-12: 292 pg/mL (ref 211–911)

## 2022-05-11 LAB — HEMOGLOBIN A1C: Hgb A1c MFr Bld: 7.3 % — ABNORMAL HIGH (ref 4.6–6.5)

## 2022-05-11 LAB — SEDIMENTATION RATE: Sed Rate: 35 mm/hr — ABNORMAL HIGH (ref 0–30)

## 2022-05-11 LAB — MICROALBUMIN / CREATININE URINE RATIO
Creatinine,U: 198.5 mg/dL
Microalb Creat Ratio: 0.4 mg/g (ref 0.0–30.0)
Microalb, Ur: 0.7 mg/dL (ref 0.0–1.9)

## 2022-05-11 LAB — VITAMIN D 25 HYDROXY (VIT D DEFICIENCY, FRACTURES): VITD: 28.12 ng/mL — ABNORMAL LOW (ref 30.00–100.00)

## 2022-05-11 MED ORDER — VITAMIN D (ERGOCALCIFEROL) 1.25 MG (50000 UNIT) PO CAPS: 50000.0000 [IU] | ORAL_CAPSULE | ORAL | 0 refills | Status: DC

## 2022-05-11 NOTE — Progress Notes (Unsigned)
Established Patient Office Visit  Subjective:  Patient ID: Lauren Avila, female    DOB: [birth year]  Age: 56 y.o. MRN: [id number]  CC:  Chief Complaint  Patient presents with   Transitions Of Care    HPI Lauren Avila is here for a transition of care visit.  Prior provider was: Waunita Schooner Pt is with acute concerns.   Has had a sensation of feeling her ears popped for a few hours, [place]. Has subsided a little bit, and she did start taking Claritin which seemed to help for some time however started with right ear drainage. For the last one week, no more popping sensation, no drainage, but still wants [place] to look at the ear.   chronic concerns:  GERD: pantoprazole, takes every other day with goal of weaning off of this in the future.   Alopecia: sometimes takes biotin, helps at times not always. Was taking rogaine but didn't like how it worked with her.   DM2: taking metformin 2000 mg daily in divided bid doses. Tolerating well, and also taking jardiance 25 mg once daily and also taking trulicity 3 mg weekly.  Lab Results  Component Value Date   HGBA1C 7.3 (H) 05/11/2022     HTN: zestoretic 10-12.5 mg once daily. Also amlodipine 10 mg.   Hyperlipidemia: taking lipitor 20 mg.   Stiffness bil hands, feels joints in hands 'freeze up'  and has decreased grip strength. Does feel stiffness in the am especially. Sometimes hands will feel swollen to where she cannot wear rings, more left hand than right. Right hand dominant. No known autoimmune disease in family.   Past Medical History:  Diagnosis Date   Anxiety    GERD (gastroesophageal reflux disease)    Hypertension    Sleep apnea    cpap    Past Surgical History:  Procedure Laterality Date   CESAREAN SECTION  1991, 2011   2x   INSERTION OF MESH N/A 02/24/2013   Procedure: INSERTION OF MESH;  Surgeon: Adin Hector, MD;  Location: [facility];  Service: General;  Laterality: N/A;    UMBILICAL HERNIA REPAIR N/A 02/24/2013   Procedure: LAPAROSCOPIC REPAIR OF SUPRA UMBILICAL AND UMBILICAL  HERNIAS  AND PLACEMENT OF PAIN PUMP;  Surgeon: Adin Hector, MD;  Location: [facility];  Service: General;  Laterality: N/A;    Family History  Problem Relation Age of Onset   Diabetes Mother    Hypertension Mother    Dementia Mother        passed at 58 y/o   Heart disease Maternal Grandmother     Social History   Socioeconomic History   Marital status: Married    Spouse name: Not on file   Number of children: 3   Years of education: Not on file   Highest education level: Not on file  Occupational History   Occupation: semi retired  Tobacco Use   Smoking status: Never   Smokeless tobacco: Never  Vaping Use   Vaping Use: Never used  Substance and Sexual Activity   Alcohol use: Yes    Comment: very rare   Drug use: No   Sexual activity: Yes    Partners: Male    Birth control/protection: Post-menopausal  Other Topics Concern   Not on file  Social History Narrative   Four children,    One child passed, one adopted, three alive    Social Determinants of Health  Financial Resource Strain: Not on file  Food Insecurity: Not on file  Transportation Needs: Not on file  Physical Activity: Not on file  Stress: Not on file  Social Connections: Not on file  Intimate Partner Violence: Not on file    Outpatient Medications Prior to Visit  Medication Sig Dispense Refill   amLODipine (NORVASC) 10 MG tablet Take 1 tablet (10 mg total) by mouth daily. 90 tablet 1   atorvastatin (LIPITOR) 20 MG tablet TAKE 1 TABLET BY MOUTH ONCE A DAY 90 tablet 3   BIOTIN PO Take by mouth.     Dulaglutide (TRULICITY) 3 MB/8.4YK SOPN Inject 3 mg into the skin once a week. 2 mL 2   empagliflozin (JARDIANCE) 25 MG TABS tablet Take 1 tablet (25 mg total) by mouth daily before breakfast. 90 tablet 3   glucose blood test strip Use 1 strip daily to check blood sugar 100 each 1   Lancets (FREESTYLE)  lancets USE 1 LANCET ONCE DAILY TO CHECK BLOOD SUGAR 100 each 1   lisinopril-hydrochlorothiazide (ZESTORETIC) 10-12.5 MG tablet Take 1 tablet by mouth daily. 90 tablet 0   metFORMIN (GLUCOPHAGE-XR) 500 MG 24 hr tablet TAKE 2 TABLETS BY MOUTH 2 TIMES DAILY WITH A MEAL 360 tablet 3   nystatin-triamcinolone ointment (MYCOLOG) Apply topically 2 (two) times daily.     pantoprazole (PROTONIX) 40 MG tablet Take 1 tablet (40 mg total) by mouth daily. 90 tablet 0   Respiratory Therapy Supplies (CARETOUCH CPAP & BIPAP HOSE) MISC by Does not apply route.     minoxidil (ROGAINE) 2 % external solution Apply topically 2 (two) times daily.     No facility-administered medications prior to visit.    No Known Allergies  ROS Review of Systems  Review of Systems  Respiratory:  Negative for shortness of breath.   HENT: popping bil ears, pnd  Cardiovascular:  Negative for chest pain and palpitations.  Gastrointestinal:  Negative for constipation and diarrhea.  Genitourinary:  Negative for dysuria, frequency and urgency.  Musculoskeletal:  Negative for myalgias.  Psychiatric/Behavioral:  Negative for depression and suicidal ideas.   All other systems reviewed and are negative.    Objective:      Physical Exam Vitals reviewed.  Constitutional:      General: She is not in acute distress.    Appearance: Normal appearance. She is obese. She is not ill-appearing, toxic-appearing or diaphoretic.  HENT:     Right Ear: Tympanic membrane normal.     Left Ear: Tympanic membrane normal.     Mouth/Throat:     Mouth: Mucous membranes are moist.     Pharynx: No pharyngeal swelling.     Tonsils: No tonsillar exudate.  Eyes:     Extraocular Movements: Extraocular movements intact.     Conjunctiva/sclera: Conjunctivae normal.     Pupils: Pupils are equal, round, and reactive to light.  Neck:     Thyroid: No thyroid mass.  Cardiovascular:     Rate and Rhythm: Normal rate and regular rhythm.  Pulmonary:      Effort: Pulmonary effort is normal.     Breath sounds: Normal breath sounds.  Musculoskeletal:        General: Normal range of motion.  Lymphadenopathy:     Cervical:     Right cervical: No superficial cervical adenopathy.    Left cervical: No superficial cervical adenopathy.  Skin:    General: Skin is warm.     Capillary Refill: Capillary refill takes less than 2  seconds.  Neurological:     General: No focal deficit present.     Mental Status: She is alert and oriented to person, place, and time.  Psychiatric:        Mood and Affect: Mood normal.        Behavior: Behavior normal.        Thought Content: Thought content normal.        Judgment: Judgment normal.      Diabetic Foot Exam - Simple   Simple Foot Form Visual Inspection Sensation Testing Pulse Check Comments Bil ankles, lateral sides of feet with white raised nodules with dry scaliness texture     Diabetic Foot Form - Detailed   Diabetic Foot Exam - detailed Can the patient see the bottom of their feet?: Yes Are the shoes appropriate in style and fit?: Yes Is there swelling or and abnormal foot shape?: Yes Is there a claw toe deformity?: No Is there elevated skin temparature?: No Is there foot or ankle muscle weakness?: No Normal Range of Motion: Yes Pulse Foot Exam completed.: Yes   Right posterior Tibialias: Present Left posterior Tibialias: Present   Right Dorsalis Pedis: Present Left Dorsalis Pedis: Present  Semmes-Weinstein Monofilament Test   Comments: Bil ankles, lateral sides of feet with white raised nodules with dry scaliness texture      BP 116/70   Pulse 87   Temp 98.6 F (37 C)   Resp 16   Ht 4' 11.5" (1.511 m)   Wt 186 lb 6 oz (84.5 kg)   SpO2 98%   BMI 37.01 kg/m  Wt Readings from Last 3 Encounters:  05/11/22 186 lb 6 oz (84.5 kg)  02/08/22 185 lb 6 oz (84.1 kg)  09/28/21 187 lb 3 oz (84.9 kg)     Health Maintenance Due  Topic Date Due   Zoster Vaccines- Shingrix (1  of 2) Never done   COVID-19 Vaccine (4 - Pfizer risk series) 08/12/2020   PAP SMEAR-Modifier  09/16/2021    There are no preventive care reminders to display for this patient.  No results found for: "TSH" Lab Results  Component Value Date   WBC 5.8 06/28/2021   HGB 13.3 06/28/2021   HCT 41.4 06/28/2021   MCV 84.1 06/28/2021   PLT 322.0 06/28/2021   Lab Results  Component Value Date   NA 138 02/08/2022   K 3.6 02/08/2022   CO2 26 02/08/2022   GLUCOSE 115 (H) 02/08/2022   BUN 15 02/08/2022   CREATININE 0.82 02/08/2022   BILITOT 0.5 02/08/2022   ALKPHOS 93 02/08/2022   AST 17 02/08/2022   ALT 24 02/08/2022   PROT 7.8 02/08/2022   ALBUMIN 4.0 02/08/2022   CALCIUM 9.5 02/08/2022   GFR 79.28 02/08/2022   Lab Results  Component Value Date   CHOL 110 02/08/2022   Lab Results  Component Value Date   HDL 50.10 02/08/2022   Lab Results  Component Value Date   LDLCALC 46 02/08/2022   Lab Results  Component Value Date   TRIG 67.0 02/08/2022   Lab Results  Component Value Date   CHOLHDL 2 02/08/2022   Lab Results  Component Value Date   HGBA1C 7.3 (H) 05/11/2022      Assessment & Plan:   Problem List Items Addressed This Visit       Cardiovascular and Mediastinum   Essential hypertension    Continue with zestoretic 10-12.5 mg once daily as well as amlodipine 10 mg  Pt advised of  the following:  Continue medication as prescribed. Monitor blood pressure periodically and/or when you feel symptomatic. Goal is <130/90 on average. Ensure that you have rested for 30 minutes prior to checking your blood pressure. Record your readings and bring them to your next visit if necessary.work on a low sodium diet.         Respiratory   Obstructive sleep apnea syndrome    stable        Digestive   Gastro-esophageal reflux disease without esophagitis    Try to decrease and or avoid spicy foods, fried fatty foods, and also caffeine and chocolate as these can increase  heartburn symptoms.          Endocrine   Type 2 diabetes mellitus with hyperglycemia, without long-term current use of insulin ([place])    a1c reviewed. Continue metformin 2000 mg daily as well as jardiance 25 m g conitnue trulicity 3 mg weekly  Advised to Work on diabetic diet and exercise as tolerated. Yearly foot exam, and annual eye exam.        Relevant Orders   Microalbumin / creatinine urine ratio (Completed)   Hemoglobin A1c (Completed)   Hyperlipidemia associated with type 2 diabetes mellitus (HCC)    continue lipitor 20 mg  Ordered lipid panel, pending results. Work on low cholesterol diet and exercise as tolerated         Musculoskeletal and Integument   Alopecia - Primary    Worsening Referral to derm        Other   Obesity (BMI 30-39.9)    Work on diet and exercise as tolerated       Vitamin D deficiency    Ordered vitamin d pending results.        Relevant Medications   Vitamin D, Ergocalciferol, (DRISDOL) 1.25 MG (50000 UNIT) CAPS capsule   Other Relevant Orders   VITAMIN D 25 Hydroxy (Vit-D Deficiency, Fractures) (Completed)   Polyarthralgia    Autoimmune w/u to r/o RF and or PSA       Relevant Orders   Sedimentation rate (Completed)   ANA (Completed)   Rheumatoid factor   Other Visit Diagnoses     Other polyneuropathy       Relevant Orders   B12 and Folate Panel (Completed)   Encounter for surveillance of abnormal nevi       Relevant Orders   Ambulatory referral to Dermatology   Screening for malignant neoplasm of skin       Relevant Orders   Ambulatory referral to Dermatology       Meds ordered this encounter  Medications   Vitamin D, Ergocalciferol, (DRISDOL) 1.25 MG (50000 UNIT) CAPS capsule    Sig: Take 1 capsule (50,000 Units total) by mouth every 7 (seven) days.    Dispense:  8 capsule    Refill:  0    Order Specific Question:   Supervising Provider    Answer:   BEDSOLE, AMY E [[id number]]    Follow-up: No follow-ups on  file.    Eugenia Pancoast, FNP

## 2022-05-11 NOTE — Patient Instructions (Signed)
  Start flonase daily and consider claritin daily as well.   Start vitamin d 2000 IU once daily over the counter.   Welcome to our clinic, I am happy to have you as my new patient. I am excited to continue on this healthcare journey with you.  Stop by the lab prior to leaving today. I will notify you of your results once received.   Please keep in mind Any my chart messages you send have up to a three business day turnaround for a response.  Phone calls may take up to a one full business day turnaround for a  response.   If you need a medication refill I recommend you request it through the pharmacy as this is easiest for [place] rather than sending a message and or phone call.   Due to recent changes in healthcare laws, you may see results of your imaging and/or laboratory studies on MyChart before I have had a chance to review them.  I understand that in some cases there may be results that are confusing or concerning to you. Please understand that not all results are received at the same time and often I may need to interpret multiple results in order to provide you with the best plan of care or course of treatment. Therefore, I ask that you please give me 2 business days to thoroughly review all your results before contacting my office for clarification. Should we see a critical lab result, you will be contacted sooner.   It was a pleasure seeing you today! Please do not hesitate to reach out with any questions and or concerns.  Regards,   Eugenia Pancoast FNP-C

## 2022-05-13 DIAGNOSIS — M2550 Pain in unspecified joint: Secondary | ICD-10-CM | POA: Insufficient documentation

## 2022-05-13 LAB — ANTI-NUCLEAR AB-TITER (ANA TITER): ANA Titer 1: 1:40 {titer} — ABNORMAL HIGH

## 2022-05-13 LAB — ANA: Anti Nuclear Antibody (ANA): POSITIVE — AB

## 2022-05-13 NOTE — Assessment & Plan Note (Signed)
Autoimmune w/u to r/o RF and or PSA

## 2022-05-13 NOTE — Assessment & Plan Note (Signed)
Work on diet and exercise as tolerated  ?

## 2022-05-13 NOTE — Assessment & Plan Note (Signed)
Try to decrease and or avoid spicy foods, fried fatty foods, and also caffeine and chocolate as these can increase heartburn symptoms.   

## 2022-05-13 NOTE — Assessment & Plan Note (Signed)
Continue with zestoretic 10-12.5 mg once daily as well as amlodipine 10 mg  Pt advised of the following:  Continue medication as prescribed. Monitor blood pressure periodically and/or when you feel symptomatic. Goal is <130/90 on average. Ensure that you have rested for 30 minutes prior to checking your blood pressure. Record your readings and bring them to your next visit if necessary.work on a low sodium diet.

## 2022-05-13 NOTE — Assessment & Plan Note (Signed)
Worsening Referral to derm

## 2022-05-13 NOTE — Assessment & Plan Note (Signed)
a1c reviewed. Continue metformin 2000 mg daily as well as jardiance 25 m g conitnue trulicity 3 mg weekly  Advised to Work on diabetic diet and exercise as tolerated. Yearly foot exam, and annual eye exam.

## 2022-05-13 NOTE — Assessment & Plan Note (Signed)
stable °

## 2022-05-13 NOTE — Assessment & Plan Note (Signed)
continue lipitor 20 mg  Ordered lipid panel, pending results. Work on low cholesterol diet and exercise as tolerated

## 2022-05-13 NOTE — Assessment & Plan Note (Signed)
Ordered vitamin d pending results.   

## 2022-05-14 ENCOUNTER — Telehealth: Payer: Self-pay

## 2022-05-14 ENCOUNTER — Other Ambulatory Visit: Payer: Self-pay | Admitting: Family

## 2022-05-14 ENCOUNTER — Other Ambulatory Visit: Payer: Self-pay

## 2022-05-14 ENCOUNTER — Other Ambulatory Visit (HOSPITAL_COMMUNITY): Payer: Self-pay

## 2022-05-14 DIAGNOSIS — E1165 Type 2 diabetes mellitus with hyperglycemia: Secondary | ICD-10-CM

## 2022-05-14 DIAGNOSIS — R768 Other specified abnormal immunological findings in serum: Secondary | ICD-10-CM

## 2022-05-14 DIAGNOSIS — M2550 Pain in unspecified joint: Secondary | ICD-10-CM

## 2022-05-14 MED ORDER — TRULICITY 4.5 MG/0.5ML ~~LOC~~ SOAJ
4.5000 mg | SUBCUTANEOUS | 5 refills | Status: DC
  Filled 2022-05-14: qty 2, 28d supply, fill #0

## 2022-05-14 NOTE — Telephone Encounter (Signed)
[place] Night - Client Nonclinical Telephone Record  [facility] Client [place] Night - Client Client Site [place] Provider Waunita Schooner- MD Contact Type Call Who Is Calling Patient / Member / Family / Caregiver Caller Name Cyntha Brickman Caller Phone Number [phone] Patient Name Lauren Avila Patient DOB 04/01/1966 Call Type Message Only Information Provided Reason for Call Medication Question / Request Initial Comment Caller states her medication needs approval prior to being delivered. She states the medication is Trulicity and its urgent. Additional Comment Caller was advised to call back during office hours. Disp. Time Disposition Final User 05/14/2022 7:48:11 AM General Information Provided Yes Jamal Maes Call Closed By: Jamal Maes Transaction Date/Time: 05/14/2022 7:45:53 AM (ET

## 2022-05-14 NOTE — Telephone Encounter (Signed)
Prior auth started for Trulicity 4.'5MG'$ /0.5ML pen-injectors. Rada Zegers Key: [id number] Per Cover My Meds:  Drug is covered by current benefit plan.  No further PA activity needed.    I called patient and left her a vmail to notify her what Cover My Meds said and to let us know if she has any trouble getting her prescription.

## 2022-05-14 NOTE — Telephone Encounter (Signed)
Sending note to [place] for PA trulicity.

## 2022-05-14 NOTE — Telephone Encounter (Signed)
Noted  

## 2022-05-14 NOTE — Telephone Encounter (Signed)
Pt called in wants to know if fax was receive to refill her RX Trulicity . Please Advise [phone]

## 2022-05-15 ENCOUNTER — Other Ambulatory Visit (HOSPITAL_COMMUNITY): Payer: Self-pay

## 2022-05-22 ENCOUNTER — Other Ambulatory Visit (HOSPITAL_COMMUNITY): Payer: Self-pay

## 2022-05-23 ENCOUNTER — Encounter (HOSPITAL_COMMUNITY): Payer: Self-pay | Admitting: Pharmacist

## 2022-05-23 ENCOUNTER — Telehealth: Payer: Self-pay | Admitting: Family

## 2022-05-23 ENCOUNTER — Encounter (HOSPITAL_COMMUNITY): Payer: Self-pay

## 2022-05-23 ENCOUNTER — Encounter: Payer: Self-pay | Admitting: Family

## 2022-05-23 ENCOUNTER — Other Ambulatory Visit (HOSPITAL_COMMUNITY): Payer: Self-pay

## 2022-05-23 DIAGNOSIS — E1165 Type 2 diabetes mellitus with hyperglycemia: Secondary | ICD-10-CM

## 2022-05-23 DIAGNOSIS — K219 Gastro-esophageal reflux disease without esophagitis: Secondary | ICD-10-CM

## 2022-05-23 DIAGNOSIS — I10 Essential (primary) hypertension: Secondary | ICD-10-CM

## 2022-05-23 NOTE — Telephone Encounter (Signed)
Patient called requesting an phone call,to discuss her medications. she stated that she's not able to get medications from [facility] ,and would like to discuss.

## 2022-05-24 ENCOUNTER — Other Ambulatory Visit (HOSPITAL_COMMUNITY): Payer: Self-pay

## 2022-05-24 MED ORDER — EMPAGLIFLOZIN 25 MG PO TABS: 25.0000 mg | ORAL_TABLET | Freq: Every day | ORAL | 3 refills | Status: DC

## 2022-05-24 MED ORDER — LISINOPRIL-HYDROCHLOROTHIAZIDE 10-12.5 MG PO TABS: 1.0000 | ORAL_TABLET | Freq: Every day | ORAL | 0 refills | Status: DC

## 2022-05-24 MED ORDER — TRULICITY 4.5 MG/0.5ML ~~LOC~~ SOAJ: 4.5000 mg | SUBCUTANEOUS | 5 refills | Status: DC

## 2022-05-24 MED ORDER — ATORVASTATIN CALCIUM 20 MG PO TABS: ORAL_TABLET | Freq: Every day | ORAL | 3 refills | Status: DC

## 2022-05-24 MED ORDER — AMLODIPINE BESYLATE 10 MG PO TABS: 10.0000 mg | ORAL_TABLET | Freq: Every day | ORAL | 1 refills | Status: DC

## 2022-05-24 MED ORDER — PANTOPRAZOLE SODIUM 40 MG PO TBEC: 40.0000 mg | DELAYED_RELEASE_TABLET | Freq: Every day | ORAL | 0 refills | Status: DC

## 2022-05-24 MED ORDER — METFORMIN HCL ER 500 MG PO TB24: ORAL_TABLET | ORAL | 3 refills | Status: DC

## 2022-05-25 ENCOUNTER — Other Ambulatory Visit (HOSPITAL_COMMUNITY): Payer: Self-pay

## 2022-05-25 NOTE — Telephone Encounter (Signed)
Left message to return call to our office.  

## 2022-05-25 NOTE — Telephone Encounter (Signed)
Spoke to pt and make sure we sent to right pharmacy. Patient stated that it was the right pharmacy.

## 2022-06-11 ENCOUNTER — Ambulatory Visit
Admission: RE | Admit: 2022-06-11 | Discharge: 2022-06-11 | Disposition: A | Discharge status: Home / Self Care | Source: Ambulatory Visit | Attending: Family Medicine | Admitting: Family Medicine

## 2022-06-11 ENCOUNTER — Other Ambulatory Visit: Payer: Self-pay | Admitting: Family Medicine

## 2022-06-11 DIAGNOSIS — N6001 Solitary cyst of right breast: Secondary | ICD-10-CM

## 2022-06-27 ENCOUNTER — Ambulatory Visit (INDEPENDENT_AMBULATORY_CARE_PROVIDER_SITE_OTHER): Admitting: Dermatology

## 2022-06-27 ENCOUNTER — Encounter: Payer: Self-pay | Admitting: Dermatology

## 2022-06-27 VITALS — BP 121/78 | HR 84

## 2022-06-27 DIAGNOSIS — L720 Epidermal cyst: Secondary | ICD-10-CM

## 2022-06-27 DIAGNOSIS — L821 Other seborrheic keratosis: Secondary | ICD-10-CM | POA: Diagnosis not present

## 2022-06-27 DIAGNOSIS — L668 Other cicatricial alopecia: Secondary | ICD-10-CM

## 2022-06-27 DIAGNOSIS — L649 Androgenic alopecia, unspecified: Secondary | ICD-10-CM | POA: Diagnosis not present

## 2022-06-27 DIAGNOSIS — L723 Sebaceous cyst: Secondary | ICD-10-CM

## 2022-06-27 MED ORDER — MUPIROCIN 2 % EX OINT: TOPICAL_OINTMENT | CUTANEOUS | 1 refills | Status: DC

## 2022-06-27 MED ORDER — DOXYCYCLINE MONOHYDRATE 100 MG PO CAPS: 100.0000 mg | ORAL_CAPSULE | Freq: Two times a day (BID) | ORAL | 0 refills | Status: AC

## 2022-06-27 MED ORDER — MUPIROCIN 2 % EX OINT: TOPICAL_OINTMENT | CUTANEOUS | 0 refills | Status: AC

## 2022-06-27 NOTE — Patient Instructions (Addendum)
Seborrheic Keratosis  What causes seborrheic keratoses? Seborrheic keratoses are harmless, common skin growths that first appear during adult life.  As time goes by, more growths appear.  Some people may develop a large number of them.  Seborrheic keratoses appear on both covered and uncovered body parts.  They are not caused by sunlight.  The tendency to develop seborrheic keratoses can be inherited.  They vary in color from skin-colored to gray, brown, or even black.  They can be either smooth or have a rough, warty surface.   Seborrheic keratoses are superficial and look as if they were stuck on the skin.  Under the microscope this type of keratosis looks like layers upon layers of skin.  That is why at times the top layer may seem to fall off, but the rest of the growth remains and re-grows.    Treatment Seborrheic keratoses do not need to be treated, but can easily be removed in the office.  Seborrheic keratoses often cause symptoms when they rub on clothing or jewelry.  Lesions can be in the way of shaving.  If they become inflamed, they can cause itching, soreness, or burning.  Removal of a seborrheic keratosis can be accomplished by freezing, burning, or surgery. If any spot bleeds, scabs, or grows rapidly, please return to have it checked, as these can be an indication of a skin cancer.  Recommend starting moisturizer with exfoliant (Urea, Salicylic acid, or Lactic acid) one to two times daily to help smooth rough and bumpy skin.  OTC options include Cetaphil Rough and Bumpy lotion (Urea), Eucerin Roughness Relief lotion or spot treatment cream (Urea), CeraVe SA lotion/cream for Rough and Bumpy skin (Sal Acid), Gold Bond Rough and Bumpy cream (Sal Acid), and AmLactin 12% lotion/cream (Lactic Acid).  If applying in morning, also apply sunscreen to sun-exposed areas, since these exfoliating moisturizers can increase sensitivity to sun.   Wash scalp with shampoo daily. Apply Mupirocin twice daily    Female Androgenic Alopecia is a chronic condition related to genetics and/or hormonal changes.  In women androgenetic alopecia is commonly associated with menopause but may occur any time after puberty.  It causes hair thinning primarily on the crown with widening of the part and temporal hairline recession.  Can use OTC Rogaine (minoxidil) 5% solution/foam as directed.  Oral treatments in female patients who have no contraindication may include : - Low dose oral minoxidil 1.25 - '5mg'$  daily - Spironolactone 50 - '100mg'$  bid - Finasteride 2.5 - 5 mg daily Adjunctive therapies include: - Low Level Laser Light Therapy (LLLT) - Platelet-rich plasma injections (PRP) - Hair Transplants or scalp reduction    Due to recent changes in healthcare laws, you may see results of your pathology and/or laboratory studies on MyChart before the doctors have had a chance to review them. We understand that in some cases there may be results that are confusing or concerning to you. Please understand that not all results are received at the same time and often the doctors may need to interpret multiple results in order to provide you with the best plan of care or course of treatment. Therefore, we ask that you please give [place] 2 business days to thoroughly review all your results before contacting the office for clarification. Should we see a critical lab result, you will be contacted sooner.   If You Need Anything After Your Visit  If you have any questions or concerns for your doctor, please call our main line at [phone]  and press option 4 to reach your doctor's medical assistant. If no one answers, please leave a voicemail as directed and we will return your call as soon as possible. Messages left after 4 pm will be answered the following business day.   You may also send [place] a message via [place]. We typically respond to MyChart messages within 1-2 business days.  For prescription refills, please ask your  pharmacy to contact our office. Our fax number is [phone].  If you have an urgent issue when the clinic is closed that cannot wait until the next business day, you can page your doctor at the number below.    Please note that while we do our best to be available for urgent issues outside of office hours, we are not available 24/7.   If you have an urgent issue and are unable to reach [place], you may choose to seek medical care at your doctor's office, retail clinic, urgent care center, or emergency room.  If you have a medical emergency, please immediately call 911 or go to the emergency department.  Pager Numbers  - Dr. Nehemiah Massed: [phone]  - Dr. Laurence Ferrari: [phone]  - Dr. Nicole Kindred: [phone]  In the event of inclement weather, please call our main line at [phone] for an update on the status of any delays or closures.  Dermatology Medication Tips: Please keep the boxes that topical medications come in in order to help keep track of the instructions about where and how to use these. Pharmacies typically print the medication instructions only on the boxes and not directly on the medication tubes.   If your medication is too expensive, please contact our office at [phone] option 4 or send [place] a message through [place].   We are unable to tell what your co-pay for medications will be in advance as this is different depending on your insurance coverage. However, we may be able to find a substitute medication at lower cost or fill out paperwork to get insurance to cover a needed medication.   If a prior authorization is required to get your medication covered by your insurance company, please allow [place] 1-2 business days to complete this process.  Drug prices often vary depending on where the prescription is filled and some pharmacies may offer cheaper prices.  The website www.goodrx.com contains coupons for medications through different pharmacies. The prices here do not  account for what the cost may be with help from insurance (it may be cheaper with your insurance), but the website can give you the price if you did not use any insurance.  - You can print the associated coupon and take it with your prescription to the pharmacy.  - You may also stop by our office during regular business hours and pick up a GoodRx coupon card.  - If you need your prescription sent electronically to a different pharmacy, notify our office through [facility] or by phone at [phone] option 4.     Si Usted Necesita Algo Despus de Su Visita  Tambin puede enviarnos un mensaje a travs de Pharmacist, community. Por lo general respondemos a los mensajes de MyChart en el transcurso de 1 a 2 das hbiles.  Para renovar recetas, por favor pida a su farmacia que se ponga en contacto con nuestra oficina. Harland Dingwall de fax es [place] [phone].  Si tiene un asunto urgente cuando la clnica est cerrada y que no puede esperar hasta el siguiente da hbil, puede llamar/localizar a su doctor(a) al  nmero que aparece a continuacin.   Por favor, tenga en cuenta que aunque hacemos todo lo posible para estar disponibles para asuntos urgentes fuera del horario de [place], no estamos disponibles las 24 horas del da, los 7 das de la [place].   Si tiene un problema urgente y no puede comunicarse con nosotros, puede optar por buscar atencin mdica  en el consultorio de su doctor(a), en una clnica privada, en un centro de atencin urgente o en una sala de emergencias.  Si tiene Engineering geologist, por favor llame inmediatamente al 911 o vaya a la sala de emergencias.  Nmeros de bper  - Dr. Nehemiah Massed: [phone]  - Dra. Moye: [phone]  - Dra. Nicole Kindred: [phone]  En caso de inclemencias del [place], por favor llame a Johnsie Kindred principal al [phone] para una actualizacin sobre el [place] de cualquier retraso o cierre.  Consejos para la medicacin en dermatologa: Por  favor, guarde las cajas en las que vienen los medicamentos de uso tpico para ayudarle a seguir las instrucciones sobre dnde y cmo usarlos. Las farmacias generalmente imprimen las instrucciones del medicamento slo en las cajas y no directamente en los tubos del [place].   Si su medicamento es muy caro, por favor, pngase en contacto con Zigmund Daniel llamando al [phone] y presione la opcin 4 o envenos un mensaje a travs de Pharmacist, community.   No podemos decirle cul ser su copago por los medicamentos por adelantado ya que esto es diferente dependiendo de la cobertura de su seguro. Sin embargo, es posible que podamos encontrar un medicamento sustituto a Electrical engineer un formulario para que el seguro cubra el medicamento que se considera necesario.   Si se requiere una autorizacin previa para que su compaa de seguros [place] su medicamento, por favor permtanos de 1 a 2 das hbiles para completar este proceso.  Los precios de los medicamentos varan con frecuencia dependiendo del Environmental consultant de dnde se surte la receta y alguna farmacias pueden ofrecer precios ms baratos.  El sitio web www.goodrx.com tiene cupones para medicamentos de Airline pilot. Los precios aqu no tienen en cuenta lo que podra costar con la ayuda del seguro (puede ser ms barato con su seguro), pero el sitio web puede darle el precio si no utiliz Research scientist (physical sciences).  - Puede imprimir el cupn correspondiente y llevarlo con su receta a la farmacia.  - Tambin puede pasar por nuestra oficina durante el horario de atencin regular y Charity fundraiser una tarjeta de cupones de GoodRx.  - Si necesita que su receta se enve electrnicamente a una farmacia diferente, informe a nuestra oficina a travs de MyChart de [place] o por telfono llamando al [phone] y presione la opcin 4.

## 2022-06-27 NOTE — Progress Notes (Signed)
New Patient Visit  Subjective  Lauren Avila is a 56 y.o. female who presents for the following: lesions (Both feet. Dry patches. Have been there for several years. Non tender. Denies itching) and Alopecia (Scalp. Has been seen and treated by Dr. Nicole Kindred in the past. ).  The patient has spots, moles and lesions to be evaluated, some may be new or changing and the patient has concerns that these could be cancer.   Objective  Well appearing patient in no apparent distress; mood and affect are within normal limits.  A focused examination was performed including scalp, face, feet. Relevant physical exam findings are noted in the Assessment and Plan.  Scalp Diffuse thinning of the crown and widening of the midline part with retention of the frontal hairline - Reviewed progressive nature and prognosis. Areas of hair loss with loss of follicular ostia at crown.  Left Parietal Scalp Subcutaneous papule/nodule with erythema and edema, tender to touch.    Assessment & Plan   Seborrheic Keratoses. Feet, ankles, left thigh - Stuck-on, waxy, tan-brown papules - Benign-appearing - Discussed benign etiology and prognosis.  Discussed cryotherapy if spot(s) become irritated or inflamed.  - Observe - Call for any changes Recommend starting moisturizer with exfoliant (Urea, Salicylic acid, or Lactic acid) one to two times daily to help smooth rough and bumpy skin.  OTC options include Cetaphil Rough and Bumpy lotion (Urea), Eucerin Roughness Relief lotion or spot treatment cream (Urea), CeraVe SA lotion/cream for Rough and Bumpy skin (Sal Acid), Gold Bond Rough and Bumpy cream (Sal Acid), and AmLactin 12% lotion/cream (Lactic Acid).  If applying in morning, also apply sunscreen to sun-exposed areas, since these exfoliating moisturizers can increase sensitivity to sun.   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 Condition is bothersome/symptomatic for patient. Currently not at goal.  Pt may be interested in starting oral treatment later, but defers at this time.  Female Androgenic Alopecia is a chronic condition related to genetics and/or hormonal changes.  In women androgenetic alopecia is commonly associated with menopause but may occur any time after puberty.  It causes hair thinning primarily on the crown with widening of the part and temporal hairline recession.  Can use OTC Rogaine (minoxidil) 5% solution/foam as directed.  Oral treatments in female patients who have no contraindication may include : - Low dose oral minoxidil 1.25 - '5mg'$  daily - Spironolactone 50 - '100mg'$  bid - Finasteride 2.5 - 5 mg daily Adjunctive therapies include: - Low Level Laser Light Therapy (LLLT) - Platelet-rich plasma injections (PRP) - Hair Transplants or scalp reduction   Doses of minoxidil for hair loss are considered 'low dose'. This is because the doses used for hair loss are a lot lower than the doses which are used for conditions such as high blood pressure (hypertension). The doses used for hypertension are 10-'40mg'$  per day.  Side effects are uncommon at the low doses (up to 2.5 mg/day) used to treat hair loss. Potential side effects, more commonly seen at higher doses, include: Increase in hair growth (hypertrichosis) elsewhere on face and body Temporary hair shedding upon starting medication which may last up to 4 weeks Ankle swelling, fluid retention, rapid weight gain more than 5 pounds Low blood pressure and feeling lightheaded or dizzy when standing up quickly Fast or irregular heartbeat Headaches   CCCA is a chronic/progressive and irreversible patterned form of scarring alopecia that most commonly  affects middle-aged women of African descent that presents with progressive hair loss, starting as a single patch at the vertex of the scalp and then expanding in a centrifugal and symmetrical pattern on the  crown. Triggering or aggravation of the disease may occur following traumatic hair care practices, such as cornrows and braiding, extensions, weaves with sewn-in or glued-on hair, use of hot combs, and frequent use of hair relaxers. These practices should be discontinued to slow progression of disease. Therapies may stop or slow progression but generally do not lead to hair regrowth in scarred areas.    Inflamed epidermoid cyst of skin Left Parietal Scalp  Symptomatic, with pain  Start Doxycycline 100 mg PO bid x 2 weeks with food  Doxycycline should be taken with food to prevent nausea. Do not lay down for 30 minutes after taking. Be cautious with sun exposure and use good sun protection while on this medication. Pregnant women should not take this medication.    Start mupirocin ointment bid until healed   Incision and Drainage - Left Parietal Scalp Location: left scalp  Informed Consent: Discussed risks (permanent scarring, light or dark discoloration, infection, pain, bleeding, bruising, redness, damage to adjacent structures, and recurrence of the lesion) and benefits of the procedure, as well as the alternatives.  Informed consent was obtained.  Preparation: The area was prepped with alcohol.  Anesthesia: Lidocaine 1% with epinephrine  Procedure Details: An incision was made overlying the lesion. The lesion drained white, chalky cyst material.  A large amount of fluid was drained.    Antibiotic ointment and a sterile pressure dressing were applied. The patient tolerated procedure well.  Total number of lesions drained: 1  Plan: The patient was instructed on post-op care. Recommend OTC analgesia as needed for pain.   doxycycline (MONODOX) 100 MG capsule - Left Parietal Scalp Take 1 capsule (100 mg total) by mouth 2 (two) times daily for 14 days. Take with food  mupirocin ointment (BACTROBAN) 2 % - Left Parietal Scalp Apply twice daily to affected area on scalp until  healed   Return if symptoms worsen or fail to improve.  I, Emelia Salisbury, CMA, am acting as scribe for Brendolyn Patty, MD.  Documentation: I have reviewed the above documentation for accuracy and completeness, and I agree with the above.  Brendolyn Patty MD

## 2022-08-20 ENCOUNTER — Other Ambulatory Visit: Payer: Self-pay | Admitting: Family

## 2022-08-20 DIAGNOSIS — I10 Essential (primary) hypertension: Secondary | ICD-10-CM

## 2022-08-28 ENCOUNTER — Telehealth: Payer: Self-pay | Admitting: Family

## 2022-08-28 ENCOUNTER — Other Ambulatory Visit (HOSPITAL_COMMUNITY): Payer: Self-pay

## 2022-08-28 DIAGNOSIS — E1165 Type 2 diabetes mellitus with hyperglycemia: Secondary | ICD-10-CM

## 2022-08-28 MED ORDER — TRULICITY 4.5 MG/0.5ML ~~LOC~~ SOAJ
4.5000 mg | SUBCUTANEOUS | 5 refills | Status: DC
  Filled 2022-08-28: qty 2, 28d supply, fill #0
  Filled 2022-09-20 (×3): qty 2, 28d supply, fill #1
  Filled 2022-10-18: qty 2, 28d supply, fill #2
  Filled 2022-11-15: qty 2, 28d supply, fill #3

## 2022-08-28 NOTE — Telephone Encounter (Signed)
Is this supposed to read that NO pharmacy has trulicity? What about [place]? Has she called around to others? Typically depends on the dose as well.

## 2022-08-28 NOTE — Telephone Encounter (Signed)
Left message to return call to our office.  

## 2022-08-28 NOTE — Telephone Encounter (Signed)
Patient called and was returning [place] call.

## 2022-08-28 NOTE — Addendum Note (Signed)
Addended by: Eugenia Pancoast on: 08/28/2022 12:56 PM   Modules accepted: Orders

## 2022-08-28 NOTE — Telephone Encounter (Signed)
Called patient reviewed all information and repeated back to me. Will call if any questions.  She will call [facility] and see if they have it. If not she will call around and see what dose anyone has available.

## 2022-08-28 NOTE — Telephone Encounter (Signed)
Patient called in and stated that np pharmacy has Dulaglutide (TRULICITY) 4.5 RZ/7.3VA SOPN. She was wanting to know what she should do next. Please advise. Thank you!

## 2022-09-20 ENCOUNTER — Other Ambulatory Visit (HOSPITAL_COMMUNITY): Payer: Self-pay

## 2022-09-20 ENCOUNTER — Other Ambulatory Visit: Payer: Self-pay

## 2022-10-01 ENCOUNTER — Other Ambulatory Visit (HOSPITAL_COMMUNITY): Payer: Self-pay

## 2022-10-01 ENCOUNTER — Other Ambulatory Visit: Payer: Self-pay

## 2022-10-01 DIAGNOSIS — E1165 Type 2 diabetes mellitus with hyperglycemia: Secondary | ICD-10-CM

## 2022-10-01 MED ORDER — AMLODIPINE BESYLATE 10 MG PO TABS
10.0000 mg | ORAL_TABLET | Freq: Every day | ORAL | 0 refills | Status: DC
  Filled 2022-10-01: qty 90, 90d supply, fill #0

## 2022-10-01 NOTE — Telephone Encounter (Signed)
Prescription Request  10/01/2022  Is this a "Controlled Substance" medicine? No  LOV: 05/11/22  What is the name of the medication or equipment? Amlodipine   Have you contacted your pharmacy to request a refill? Yes   Which pharmacy would you like this sent to?  [facility]    Patient notified that their request is being sent to the clinical staff for review and that they should receive a response within 2 business days.   Please advise at [facility] [phone]

## 2022-10-08 ENCOUNTER — Encounter: Payer: Self-pay | Admitting: Family

## 2022-10-08 DIAGNOSIS — E1165 Type 2 diabetes mellitus with hyperglycemia: Secondary | ICD-10-CM

## 2022-10-08 NOTE — Telephone Encounter (Signed)
Patient is requesting to have this rx amLODipine (NORVASC) 10 MG tablet sent to [facility]. LV- 06/27/22 LR-10/01/22 (90 tabs/ no refills) Nv- Not scheduled

## 2022-10-09 MED ORDER — AMLODIPINE BESYLATE 10 MG PO TABS: 10.0000 mg | ORAL_TABLET | Freq: Every day | ORAL | 0 refills | Status: DC

## 2022-10-18 ENCOUNTER — Other Ambulatory Visit: Payer: Self-pay

## 2022-11-09 ENCOUNTER — Other Ambulatory Visit: Payer: Self-pay | Admitting: Family

## 2022-11-09 DIAGNOSIS — K219 Gastro-esophageal reflux disease without esophagitis: Secondary | ICD-10-CM

## 2022-11-09 NOTE — Telephone Encounter (Signed)
Last given in October for 90 day with no refills. Ok to refill?

## 2022-11-12 ENCOUNTER — Encounter: Payer: Self-pay | Admitting: Family

## 2022-11-12 ENCOUNTER — Ambulatory Visit: Admitting: Family

## 2022-11-12 VITALS — BP 128/84 | HR 78 | Temp 97.9°F | Ht 59.5 in | Wt 188.6 lb

## 2022-11-12 DIAGNOSIS — E785 Hyperlipidemia, unspecified: Secondary | ICD-10-CM | POA: Diagnosis not present

## 2022-11-12 DIAGNOSIS — E1165 Type 2 diabetes mellitus with hyperglycemia: Secondary | ICD-10-CM | POA: Diagnosis not present

## 2022-11-12 DIAGNOSIS — I10 Essential (primary) hypertension: Secondary | ICD-10-CM | POA: Diagnosis not present

## 2022-11-12 DIAGNOSIS — E669 Obesity, unspecified: Secondary | ICD-10-CM

## 2022-11-12 DIAGNOSIS — E538 Deficiency of other specified B group vitamins: Secondary | ICD-10-CM | POA: Insufficient documentation

## 2022-11-12 DIAGNOSIS — K219 Gastro-esophageal reflux disease without esophagitis: Secondary | ICD-10-CM | POA: Diagnosis not present

## 2022-11-12 DIAGNOSIS — E559 Vitamin D deficiency, unspecified: Secondary | ICD-10-CM | POA: Diagnosis not present

## 2022-11-12 DIAGNOSIS — E1169 Type 2 diabetes mellitus with other specified complication: Secondary | ICD-10-CM

## 2022-11-12 DIAGNOSIS — Z79899 Other long term (current) drug therapy: Secondary | ICD-10-CM

## 2022-11-12 DIAGNOSIS — M25521 Pain in right elbow: Secondary | ICD-10-CM

## 2022-11-12 LAB — LIPID PANEL
Cholesterol: 92 mg/dL (ref 0–200)
HDL: 42.6 mg/dL (ref >39.00)
LDL Cholesterol: 35 mg/dL (ref 0–99)
NonHDL: 49.74
Total CHOL/HDL Ratio: 2
Triglycerides: 75 mg/dL (ref 0.0–149.0)
VLDL: 15 mg/dL (ref 0.0–40.0)

## 2022-11-12 LAB — COMPREHENSIVE METABOLIC PANEL
ALT: 24 U/L (ref 0–35)
AST: 17 U/L (ref 0–37)
Albumin: 4 g/dL (ref 3.5–5.2)
Alkaline Phosphatase: 84 U/L (ref 39–117)
BUN: 12 mg/dL (ref 6–23)
CO2: 27 mEq/L (ref 19–32)
Calcium: 9.5 mg/dL (ref 8.4–10.5)
Chloride: 102 mEq/L (ref 96–112)
Creatinine, Ser: 0.76 mg/dL (ref 0.40–1.20)
GFR: 86.38 mL/min (ref >60.00)
Glucose, Bld: 133 mg/dL — ABNORMAL HIGH (ref 70–99)
Potassium: 3.2 mEq/L — ABNORMAL LOW (ref 3.5–5.1)
Sodium: 142 mEq/L (ref 135–145)
Total Bilirubin: 0.5 mg/dL (ref 0.2–1.2)
Total Protein: 7.9 g/dL (ref 6.0–8.3)

## 2022-11-12 LAB — VITAMIN B12: Vitamin B-12: 1271 pg/mL — ABNORMAL HIGH (ref 211–911)

## 2022-11-12 LAB — MICROALBUMIN / CREATININE URINE RATIO
Creatinine,U: 116.4 mg/dL
Microalb Creat Ratio: 0.6 mg/g (ref 0.0–30.0)
Microalb, Ur: <0.7 mg/dL (ref 0.0–1.9)

## 2022-11-12 LAB — HEMOGLOBIN A1C: Hgb A1c MFr Bld: 7.5 % — ABNORMAL HIGH (ref 4.6–6.5)

## 2022-11-12 LAB — VITAMIN D 25 HYDROXY (VIT D DEFICIENCY, FRACTURES): VITD: 24.81 ng/mL — ABNORMAL LOW (ref 30.00–100.00)

## 2022-11-12 MED ORDER — PANTOPRAZOLE SODIUM 20 MG PO TBEC
20.0000 mg | DELAYED_RELEASE_TABLET | Freq: Every day | ORAL | 0 refills | Status: DC
Start: 2022-11-12 — End: 2023-02-11

## 2022-11-12 NOTE — Assessment & Plan Note (Signed)
Heat when able rest as able.  Compression elbow sleeve  Will hold off on xray for now.

## 2022-11-12 NOTE — Assessment & Plan Note (Signed)
Continue trulicity 4.5 mg weekly  Jardiance and metformin.  Urine microalbumin and A1c today pending results.

## 2022-11-12 NOTE — Assessment & Plan Note (Signed)
Ordered b12 pending results  °

## 2022-11-12 NOTE — Assessment & Plan Note (Signed)
stable °

## 2022-11-12 NOTE — Assessment & Plan Note (Signed)
Continue daily supplementation  Ordered vitamin d pending results.

## 2022-11-12 NOTE — Progress Notes (Signed)
Established Patient Office Visit  Subjective:      CC:  Chief Complaint  Patient presents with   Medical Management of Chronic Issues    Fasting    HPI: Lauren Avila is a 57 y.o. female presenting on 11/12/2022 for Medical Management of Chronic Issues (Fasting) . Wt Readings from Last 3 Encounters:  11/12/22 188 lb 9.6 oz (85.5 kg)  05/11/22 186 lb 6 oz (84.5 kg)  02/08/22 185 lb 6 oz (84.1 kg)   Concerned with her weight, she has not checked her thyroid recently. She does state hard to lose weight, exercise: 2-3 times a week about 30 minutes at a time. Has made some diet changes, tastes have changed and certain foods don't appeal to her as much anymore. She is now eating more salads dressing is a vinaigrette and also working on portion control.   Vitamin D def: taking 2-3 times a week, often forgetful.   Sed rate elevated, ANA only low positive however with ongoing joint pains   Right arm with tenderness to right medial elbow. Dull achy pain. She is right handed so uses that arm often. Also when she tries to crochet her hands will feel very achy.   GERD: pantoprazole 40 mg was taking every other day. She has thought about decreasing this but has forgot on the second day at times and she feels the reflux at a dull ache. She does eat tomato and red sauce, not as much fried foods.     Social history:  Relevant past medical, surgical, family and social history reviewed and updated as indicated. Interim medical history since our last visit reviewed.  Allergies and medications reviewed and updated.  DATA REVIEWED: CHART IN EPIC     ROS: Negative unless specifically indicated above in HPI.    Current Outpatient Medications:    amLODipine (NORVASC) 10 MG tablet, Take 1 tablet (10 mg total) by mouth daily., Disp: 90 tablet, Rfl: 0   atorvastatin (LIPITOR) 20 MG tablet, TAKE 1 TABLET BY MOUTH ONCE A DAY, Disp: 90 tablet, Rfl: 3   BIOTIN PO, Take by mouth., Disp: , Rfl:     Cholecalciferol (VITAMIN D3) 50 MCG (2000 UT) CAPS, Take by mouth., Disp: , Rfl:    Dulaglutide (TRULICITY) 4.5 MG/0.5ML SOPN, Inject 4.5 mg as directed once a week., Disp: 2 mL, Rfl: 5   empagliflozin (JARDIANCE) 25 MG TABS tablet, Take 1 tablet (25 mg total) by mouth daily before breakfast., Disp: 90 tablet, Rfl: 3   glucose blood test strip, Use 1 strip daily to check blood sugar, Disp: 100 each, Rfl: 1   Lancets (FREESTYLE) lancets, USE 1 LANCET ONCE DAILY TO CHECK BLOOD SUGAR, Disp: 100 each, Rfl: 1   lisinopril-hydrochlorothiazide (ZESTORETIC) 10-12.5 MG tablet, TAKE 1 TABLET DAILY, Disp: 90 tablet, Rfl: 3   metFORMIN (GLUCOPHAGE-XR) 500 MG 24 hr tablet, TAKE 2 TABLETS BY MOUTH 2 TIMES DAILY WITH A MEAL, Disp: 360 tablet, Rfl: 3   mupirocin ointment (BACTROBAN) 2 %, Apply twice daily to affected area on scalp until healed, Disp: 22 g, Rfl: 0   nystatin-triamcinolone ointment (MYCOLOG), Apply topically 2 (two) times daily., Disp: , Rfl:    pantoprazole (PROTONIX) 20 MG tablet, Take 1 tablet (20 mg total) by mouth daily., Disp: 90 tablet, Rfl: 0   Respiratory Therapy Supplies (CARETOUCH CPAP & BIPAP HOSE) MISC, by Does not apply route., Disp: , Rfl:       Objective:    BP 128/84  Pulse 78   Temp 97.9 F (36.6 C) (Temporal)   Ht 4' 11.5" (1.511 m)   Wt 188 lb 9.6 oz (85.5 kg)   SpO2 98%   BMI 37.46 kg/m   Wt Readings from Last 3 Encounters:  11/12/22 188 lb 9.6 oz (85.5 kg)  05/11/22 186 lb 6 oz (84.5 kg)  02/08/22 185 lb 6 oz (84.1 kg)    Physical Exam Constitutional:      General: She is not in acute distress.    Appearance: Normal appearance. She is obese. She is not ill-appearing, toxic-appearing or diaphoretic.  HENT:     Head: Normocephalic.  Cardiovascular:     Rate and Rhythm: Normal rate and regular rhythm.  Pulmonary:     Effort: Pulmonary effort is normal.  Musculoskeletal:        General: Normal range of motion.     Comments: Slight tenderness at medial  epicondyle No edema or erythema  Neurological:     General: No focal deficit present.     Mental Status: She is alert and oriented to person, place, and time. Mental status is at baseline.  Psychiatric:        Mood and Affect: Mood normal.        Behavior: Behavior normal.        Thought Content: Thought content normal.        Judgment: Judgment normal.          Assessment & Plan:  Gastro-esophageal reflux disease without esophagitis Assessment & Plan: Goal is to decrease protonix from 40 mg to 20 mg  20 mg sent to pharmacy   Orders: -     Pantoprazole Sodium; Take 1 tablet (20 mg total) by mouth daily.  Dispense: 90 tablet; Refill: 0  Essential hypertension Assessment & Plan: stable  Orders: -     Microalbumin / creatinine urine ratio -     Comprehensive metabolic panel  Type 2 diabetes mellitus with hyperglycemia, without long-term current use of insulin Assessment & Plan: Continue trulicity 4.5 mg weekly  Jardiance and metformin.  Urine microalbumin and A1c today pending results.   Orders: -     Hemoglobin A1c  Obesity (BMI 30-39.9)  Vitamin D deficiency Assessment & Plan: Continue daily supplementation  Ordered vitamin d pending results.    Orders: -     VITAMIN D 25 Hydroxy (Vit-D Deficiency, Fractures)  Low serum vitamin B12 Assessment & Plan: Ordered b12 pending results    Orders: -     Vitamin B12  Hyperlipidemia associated with type 2 diabetes mellitus -     Lipid panel -     Comprehensive metabolic panel  On statin therapy -     Comprehensive metabolic panel  Right elbow pain Assessment & Plan: Heat when able rest as able.  Compression elbow sleeve  Will hold off on xray for now.       Return in about 6 months (around 05/14/2023) for f/u diabetes.  Mort Sawyers, MSN, APRN, FNP-C [facility]

## 2022-11-12 NOTE — Assessment & Plan Note (Signed)
Goal is to decrease protonix from 40 mg to 20 mg  20 mg sent to pharmacy
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
## 2022-11-14 ENCOUNTER — Other Ambulatory Visit (HOSPITAL_COMMUNITY): Payer: Self-pay

## 2022-11-14 ENCOUNTER — Other Ambulatory Visit: Payer: Self-pay

## 2022-11-14 ENCOUNTER — Other Ambulatory Visit: Payer: Self-pay | Admitting: Family

## 2022-11-14 DIAGNOSIS — E559 Vitamin D deficiency, unspecified: Secondary | ICD-10-CM

## 2022-11-14 DIAGNOSIS — E876 Hypokalemia: Secondary | ICD-10-CM

## 2022-11-14 MED ORDER — VITAMIN D (ERGOCALCIFEROL) 1.25 MG (50000 UNIT) PO CAPS
50000.0000 [IU] | ORAL_CAPSULE | ORAL | 0 refills | Status: DC
Start: 2022-11-14 — End: 2023-05-14
  Filled 2022-11-14: qty 8, 56d supply, fill #0

## 2022-11-14 MED ORDER — POTASSIUM CHLORIDE CRYS ER 20 MEQ PO TBCR
20.0000 meq | EXTENDED_RELEASE_TABLET | Freq: Two times a day (BID) | ORAL | 0 refills | Status: DC
Start: 2022-11-14 — End: 2023-03-08
  Filled 2022-11-14: qty 20, 10d supply, fill #0

## 2022-11-14 NOTE — Progress Notes (Signed)
Your vitamin D was a bit on the lower range. I will send an RX for vitamin D3 50,000 IU which you will take once weekly for 8 weeks. Once RX is complete, please continue over the counter Vitamin D3 1000 IU once daily. Return to the clinic for a follow up in three months to repeat your Vitamin D level.   What dose b12 are you taking, we need to decrease dose as your b12 is excessive.  Diabetic numbers are increasing not at goal.  Have increased from 7.3 to 7.5, however I do believe we just recently increased the trulicity correct? If yes we will continue this and make nurse only visit for repeat A1c in three months.  Potassium is a bit low.  Sending in rx for potassium 20 meq bid x 10 days.   Cholesterol looks great however

## 2022-11-15 ENCOUNTER — Other Ambulatory Visit (HOSPITAL_COMMUNITY): Payer: Self-pay

## 2022-11-16 ENCOUNTER — Telehealth: Payer: Self-pay

## 2022-11-16 DIAGNOSIS — E1165 Type 2 diabetes mellitus with hyperglycemia: Secondary | ICD-10-CM

## 2022-11-16 MED ORDER — TRULICITY 4.5 MG/0.5ML ~~LOC~~ SOAJ
4.5000 mg | SUBCUTANEOUS | 5 refills | Status: DC
Start: 2022-11-16 — End: 2022-12-17

## 2022-11-16 NOTE — Telephone Encounter (Signed)
LOV- 11/12/22

## 2022-11-27 ENCOUNTER — Ambulatory Visit

## 2022-11-27 ENCOUNTER — Ambulatory Visit: Attending: Rheumatology | Admitting: Rheumatology

## 2022-11-27 ENCOUNTER — Ambulatory Visit (INDEPENDENT_AMBULATORY_CARE_PROVIDER_SITE_OTHER)

## 2022-11-27 ENCOUNTER — Encounter: Payer: Self-pay | Admitting: Rheumatology

## 2022-11-27 VITALS — BP 131/84 | HR 92 | Resp 16 | Ht 59.0 in | Wt 188.6 lb

## 2022-11-27 DIAGNOSIS — K219 Gastro-esophageal reflux disease without esophagitis: Secondary | ICD-10-CM

## 2022-11-27 DIAGNOSIS — E669 Obesity, unspecified: Secondary | ICD-10-CM

## 2022-11-27 DIAGNOSIS — R768 Other specified abnormal immunological findings in serum: Secondary | ICD-10-CM

## 2022-11-27 DIAGNOSIS — G4733 Obstructive sleep apnea (adult) (pediatric): Secondary | ICD-10-CM

## 2022-11-27 DIAGNOSIS — E785 Hyperlipidemia, unspecified: Secondary | ICD-10-CM

## 2022-11-27 DIAGNOSIS — M2550 Pain in unspecified joint: Secondary | ICD-10-CM

## 2022-11-27 DIAGNOSIS — E1165 Type 2 diabetes mellitus with hyperglycemia: Secondary | ICD-10-CM

## 2022-11-27 DIAGNOSIS — M25562 Pain in left knee: Secondary | ICD-10-CM

## 2022-11-27 DIAGNOSIS — R7689 Other specified abnormal immunological findings in serum: Secondary | ICD-10-CM

## 2022-11-27 DIAGNOSIS — L659 Nonscarring hair loss, unspecified: Secondary | ICD-10-CM

## 2022-11-27 DIAGNOSIS — M25561 Pain in right knee: Secondary | ICD-10-CM

## 2022-11-27 DIAGNOSIS — E1169 Type 2 diabetes mellitus with other specified complication: Secondary | ICD-10-CM

## 2022-11-27 DIAGNOSIS — M25521 Pain in right elbow: Secondary | ICD-10-CM

## 2022-11-27 DIAGNOSIS — M79642 Pain in left hand: Secondary | ICD-10-CM

## 2022-11-27 DIAGNOSIS — M79641 Pain in right hand: Secondary | ICD-10-CM

## 2022-11-27 DIAGNOSIS — E538 Deficiency of other specified B group vitamins: Secondary | ICD-10-CM

## 2022-11-27 DIAGNOSIS — L821 Other seborrheic keratosis: Secondary | ICD-10-CM

## 2022-11-27 DIAGNOSIS — G8929 Other chronic pain: Secondary | ICD-10-CM

## 2022-11-27 DIAGNOSIS — I10 Essential (primary) hypertension: Secondary | ICD-10-CM

## 2022-11-27 DIAGNOSIS — K5790 Diverticulosis of intestine, part unspecified, without perforation or abscess without bleeding: Secondary | ICD-10-CM

## 2022-11-27 DIAGNOSIS — E559 Vitamin D deficiency, unspecified: Secondary | ICD-10-CM

## 2022-11-27 NOTE — Patient Instructions (Signed)
Hand Exercises Hand exercises can be helpful for almost anyone. These exercises can strengthen the hands, improve flexibility and movement, and increase blood flow to the hands. These results can make work and daily tasks easier. Hand exercises can be especially helpful for people who have joint pain from arthritis or have nerve damage from overuse (carpal tunnel syndrome). These exercises can also help people who have injured a hand. Exercises Most of these hand exercises are gentle stretching and motion exercises. It is usually safe to do them often throughout the day. Warming up your hands before exercise may help to reduce stiffness. You can do this with gentle massage or by placing your hands in warm water for 10-15 minutes. It is normal to feel some stretching, pulling, tightness, or mild discomfort as you begin new exercises. This will gradually improve. Stop an exercise right away if you feel sudden, severe pain or your pain gets worse. Ask your health care provider which exercises are best for you. Knuckle bend or "claw" fist  Stand or sit with your arm, hand, and all five fingers pointed straight up. Make sure to keep your wrist straight during the exercise. Gently bend your fingers down toward your palm until the tips of your fingers are touching the top of your palm. Keep your big knuckle straight and just bend the small knuckles in your fingers. Hold this position for __________ seconds. Straighten (extend) your fingers back to the starting position. Repeat this exercise 5-10 times with each hand. Full finger fist  Stand or sit with your arm, hand, and all five fingers pointed straight up. Make sure to keep your wrist straight during the exercise. Gently bend your fingers into your palm until the tips of your fingers are touching the middle of your palm. Hold this position for __________ seconds. Extend your fingers back to the starting position, stretching every joint fully. Repeat  this exercise 5-10 times with each hand. Straight fist Stand or sit with your arm, hand, and all five fingers pointed straight up. Make sure to keep your wrist straight during the exercise. Gently bend your fingers at the big knuckle, where your fingers meet your hand, and the middle knuckle. Keep the knuckle at the tips of your fingers straight and try to touch the bottom of your palm. Hold this position for __________ seconds. Extend your fingers back to the starting position, stretching every joint fully. Repeat this exercise 5-10 times with each hand. Tabletop  Stand or sit with your arm, hand, and all five fingers pointed straight up. Make sure to keep your wrist straight during the exercise. Gently bend your fingers at the big knuckle, where your fingers meet your hand, as far down as you can while keeping the small knuckles in your fingers straight. Think of forming a tabletop with your fingers. Hold this position for __________ seconds. Extend your fingers back to the starting position, stretching every joint fully. Repeat this exercise 5-10 times with each hand. Finger spread  Place your hand flat on a table with your palm facing down. Make sure your wrist stays straight as you do this exercise. Spread your fingers and thumb apart from each other as far as you can until you feel a gentle stretch. Hold this position for __________ seconds. Bring your fingers and thumb tight together again. Hold this position for __________ seconds. Repeat this exercise 5-10 times with each hand. Making circles  Stand or sit with your arm, hand, and all five fingers pointed   straight up. Make sure to keep your wrist straight during the exercise. Make a circle by touching the tip of your thumb to the tip of your index finger. Hold for __________ seconds. Then open your hand wide. Repeat this motion with your thumb and each finger on your hand. Repeat this exercise 5-10 times with each hand. Thumb  motion  Sit with your forearm resting on a table and your wrist straight. Your thumb should be facing up toward the ceiling. Keep your fingers relaxed as you move your thumb. Lift your thumb up as high as you can toward the ceiling. Hold for __________ seconds. Bend your thumb across your palm as far as you can, reaching the tip of your thumb for the small finger (pinkie) side of your palm. Hold for __________ seconds. Repeat this exercise 5-10 times with each hand. Grip strengthening  Hold a stress ball or other soft ball in the middle of your hand. Slowly increase the pressure, squeezing the ball as much as you can without causing pain. Think of bringing the tips of your fingers into the middle of your palm. All of your finger joints should bend when doing this exercise. Hold your squeeze for __________ seconds, then relax. Repeat this exercise 5-10 times with each hand. Contact a health care provider if: Your hand pain or discomfort gets much worse when you do an exercise. Your hand pain or discomfort does not improve within 2 hours after you exercise. If you have any of these problems, stop doing these exercises right away. Do not do them again unless your health care provider says that you can. Get help right away if: You develop sudden, severe hand pain or swelling. If this happens, stop doing these exercises right away. Do not do them again unless your health care provider says that you can. This information is not intended to replace advice given to you by your health care provider. Make sure you discuss any questions you have with your health care provider. Document Revised: 10/27/2020 Document Reviewed: 11/03/2020 Elsevier Patient Education  2023 [employer]  Exercises for Chronic Knee Pain Chronic knee pain is pain that lasts longer than 3 months. For most people with chronic knee pain, exercise and weight loss is an important part of treatment. Your health care provider may want  you to focus on: Strengthening the muscles that support your knee. This can take pressure off your knee and lessen pain. Preventing knee stiffness. Maintaining or increasing how far you can move your knee. Losing weight (if this applies) to take pressure off your knee, decrease your risk for injury, and make it easier for you to exercise. Your health care provider will help you develop an exercise program that matches your needs and physical abilities. Below are simple, low-impact exercises you can do at home. Ask your health care provider or a physical therapist how often you should do your exercise program and how many times to repeat each exercise. General safety tips Follow these safety tips for exercising with chronic knee pain: Get your health care provider's approval before doing any exercises. Start slowly and stop any time an exercise causes pain. Do not exercise if your knee pain is flaring up. Warm up first. Stretching a cold muscle can cause an injury. Do 5-10 minutes of easy movement or light stretching before beginning your exercise routine. Do 5-10 minutes of low-impact activity (like walking or cycling) before starting strengthening exercises. Contact your health care provider any time you have   pain during or after exercising. Exercise may cause discomfort but should not be painful. It is normal to be a little stiff or sore after exercising.  Stretching and range-of-motion exercises Front thigh stretch  Stand up straight and support your body by holding on to a chair or resting one hand on a wall. With your legs straight and close together, bend one knee to lift your heel up toward your buttocks. Using one hand for support, grab your ankle with your free hand. Pull your foot up closer toward your buttocks to feel the stretch in front of your thigh. Hold the stretch for 30 seconds. Repeat __________ times. Complete this exercise __________ times a day. Back thigh stretch  Sit  on the floor with your back straight and your legs out straight in front of you. Place the palms of your hands on the floor and slide them toward your feet as you bend at the hip. Try to touch your nose to your knees and feel the stretch in the back of your thighs. Hold for 30 seconds. Repeat __________ times. Complete this exercise __________ times a day. Calf stretch  Stand facing a wall. Place the palms of your hands flat against the wall, arms extended, and lean slightly against the wall. Get into a lunge position with one leg bent at the knee and the other leg stretched out straight behind you. Keep both feet facing the wall and increase the bend in your knee while keeping the heel of the other leg flat on the ground. You should feel the stretch in your calf. Hold for 30 seconds. Repeat __________ times. Complete this exercise __________ times a day. Strengthening exercises Straight leg lift Lie on your back with one knee bent and the other leg out straight. Slowly lift the straight leg without bending the knee. Lift until your foot is about 12 inches (30 cm) off the floor. Hold for 3-5 seconds and slowly lower your leg. Repeat __________ times. Complete this exercise __________ times a day. Single leg dip Stand between two chairs and put both hands on the backs of the chairs for support. Extend one leg out straight with your body weight resting on the heel of the standing leg. Slowly bend your standing knee to dip your body to the level that is comfortable for you. Hold for 3-5 seconds. Repeat __________ times. Complete this exercise __________ times a day. Hamstring curls Stand straight, knees close together, facing the back of a chair. Hold on to the back of a chair with both hands. Keep one leg straight. Bend the other knee while bringing the heel up toward the buttock until the knee is bent at a 90-degree angle (right angle). Hold for 3-5 seconds. Repeat __________ times.  Complete this exercise __________ times a day. Wall squat Stand straight with your back, hips, and head against a wall. Step forward one foot at a time with your back still against the wall. Your feet should be 2 feet (61 cm) from the wall at shoulder width. Keeping your back, hips, and head against the wall, slide down the wall to as close of a sitting position as you can get. Hold for 5-10 seconds, then slowly slide back up. Repeat __________ times. Complete this exercise __________ times a day. Step-ups Step up with one foot onto a sturdy platform or stool that is about 6 inches (15 cm) high. Face sideways with one foot on the platform and one on the ground. Place all your weight   on the platform foot and lift your body off the ground until your knee extends. Let your other leg hang free to the side. Hold for 3-5 seconds then slowly lower your weight down to the floor foot. Repeat __________ times. Complete this exercise __________ times a day. Contact a health care provider if: Your exercise causes pain. Your pain is worse after you exercise. Your pain prevents you from doing your exercises. This information is not intended to replace advice given to you by your health care provider. Make sure you discuss any questions you have with your health care provider. Document Revised: 11/19/2019 Document Reviewed: 07/13/2019 Elsevier Patient Education  2023 Else[employer] Inc.  

## 2022-11-28 ENCOUNTER — Ambulatory Visit
Admission: RE | Admit: 2022-11-28 | Discharge: 2022-11-28 | Disposition: A | Discharge status: Home / Self Care | Source: Ambulatory Visit | Attending: Family Medicine | Admitting: Family Medicine

## 2022-11-28 DIAGNOSIS — N6001 Solitary cyst of right breast: Secondary | ICD-10-CM

## 2022-12-17 ENCOUNTER — Encounter: Payer: Self-pay | Admitting: Family

## 2022-12-17 ENCOUNTER — Other Ambulatory Visit (HOSPITAL_COMMUNITY): Payer: Self-pay

## 2022-12-17 ENCOUNTER — Other Ambulatory Visit: Payer: Self-pay | Admitting: Family

## 2022-12-17 DIAGNOSIS — E1165 Type 2 diabetes mellitus with hyperglycemia: Secondary | ICD-10-CM

## 2022-12-17 MED ORDER — SEMAGLUTIDE (2 MG/DOSE) 8 MG/3ML ~~LOC~~ SOPN
2.0000 mg | PEN_INJECTOR | SUBCUTANEOUS | 0 refills | Status: DC
Start: 2022-12-17 — End: 2022-12-24
  Filled 2022-12-17: qty 9, 84d supply, fill #0

## 2022-12-21 ENCOUNTER — Other Ambulatory Visit (HOSPITAL_COMMUNITY): Payer: Self-pay

## 2022-12-21 ENCOUNTER — Telehealth: Payer: Self-pay | Admitting: Family

## 2022-12-21 DIAGNOSIS — E1165 Type 2 diabetes mellitus with hyperglycemia: Secondary | ICD-10-CM

## 2022-12-21 NOTE — Telephone Encounter (Signed)
Patient would likeSemaglutide, 2 MG/DOSE, 8 MG/3ML SOPN  resent to  [facility] - [place], [place] - [address] Phone: [phone]  Fax: [phone]     ,due to her insurance not covering it being sent to [facility].

## 2022-12-22 ENCOUNTER — Encounter: Payer: Self-pay | Admitting: Family

## 2022-12-24 MED ORDER — SEMAGLUTIDE (2 MG/DOSE) 8 MG/3ML ~~LOC~~ SOPN
2.0000 mg | PEN_INJECTOR | SUBCUTANEOUS | 0 refills | Status: DC
Start: 2022-12-24 — End: 2023-01-23
  Filled 2022-12-24: qty 3, 28d supply, fill #0
  Filled 2023-01-12 – 2023-01-16 (×4): qty 3, 28d supply, fill #1

## 2022-12-25 ENCOUNTER — Other Ambulatory Visit: Payer: Self-pay

## 2022-12-26 ENCOUNTER — Other Ambulatory Visit (HOSPITAL_COMMUNITY): Payer: Self-pay

## 2022-12-26 ENCOUNTER — Telehealth: Payer: Self-pay

## 2022-12-26 NOTE — Telephone Encounter (Signed)
Patient Advocate Encounter  Prior Authorization for Ozempic (2 MG/DOSE) 8MG /3ML pen-injectors has been approved with [employer].    PA# [id number] Effective dates: 11/26/22 through 07/29/2098  Per [facility] test claim, copay for 28 days supply is $76

## 2022-12-26 NOTE — Telephone Encounter (Signed)
Left message to return call to our office.  

## 2022-12-26 NOTE — Telephone Encounter (Signed)
Pt called back to advise that her Ozempic needs a PA. I did inform her that this request was sent yesterday to submit the PA and we'll notify her once resolved.

## 2022-12-26 NOTE — Telephone Encounter (Signed)
Can we inquire what she is calling about? Sent in ozempic 2 mg to [facility] yesterday.

## 2022-12-26 NOTE — Telephone Encounter (Signed)
Pt called in requesting a call back regarding medication concerns #[phone]

## 2022-12-27 ENCOUNTER — Encounter: Payer: Self-pay | Admitting: Pharmacist

## 2022-12-27 ENCOUNTER — Other Ambulatory Visit: Payer: Self-pay

## 2022-12-27 ENCOUNTER — Other Ambulatory Visit (HOSPITAL_COMMUNITY): Payer: Self-pay

## 2022-12-28 ENCOUNTER — Other Ambulatory Visit (HOSPITAL_COMMUNITY): Payer: Self-pay

## 2023-01-07 ENCOUNTER — Other Ambulatory Visit: Payer: Self-pay | Admitting: Family

## 2023-01-07 DIAGNOSIS — E1165 Type 2 diabetes mellitus with hyperglycemia: Secondary | ICD-10-CM

## 2023-01-12 ENCOUNTER — Other Ambulatory Visit (HOSPITAL_COMMUNITY): Payer: Self-pay

## 2023-01-15 ENCOUNTER — Other Ambulatory Visit: Payer: Self-pay

## 2023-01-16 ENCOUNTER — Other Ambulatory Visit (HOSPITAL_COMMUNITY): Payer: Self-pay

## 2023-01-22 ENCOUNTER — Other Ambulatory Visit (HOSPITAL_COMMUNITY): Payer: Self-pay

## 2023-01-23 ENCOUNTER — Other Ambulatory Visit (HOSPITAL_COMMUNITY): Payer: Self-pay

## 2023-01-23 ENCOUNTER — Other Ambulatory Visit: Payer: Self-pay

## 2023-01-23 DIAGNOSIS — E1165 Type 2 diabetes mellitus with hyperglycemia: Secondary | ICD-10-CM

## 2023-01-23 NOTE — Telephone Encounter (Signed)
Ozempic 3ML/ML LOV 11/12/2022 NOV 05/14/2023 Last refill 12/24/2022

## 2023-01-24 MED ORDER — SEMAGLUTIDE (2 MG/DOSE) 8 MG/3ML ~~LOC~~ SOPN
2.0000 mg | PEN_INJECTOR | SUBCUTANEOUS | 0 refills | Status: DC
Start: 2023-01-24 — End: 2023-04-04

## 2023-02-07 ENCOUNTER — Other Ambulatory Visit: Payer: Self-pay | Admitting: Family

## 2023-02-07 ENCOUNTER — Other Ambulatory Visit (INDEPENDENT_AMBULATORY_CARE_PROVIDER_SITE_OTHER)

## 2023-02-07 ENCOUNTER — Ambulatory Visit

## 2023-02-07 DIAGNOSIS — E559 Vitamin D deficiency, unspecified: Secondary | ICD-10-CM

## 2023-02-07 DIAGNOSIS — E1165 Type 2 diabetes mellitus with hyperglycemia: Secondary | ICD-10-CM

## 2023-02-07 DIAGNOSIS — E538 Deficiency of other specified B group vitamins: Secondary | ICD-10-CM

## 2023-02-07 LAB — VITAMIN D 25 HYDROXY (VIT D DEFICIENCY, FRACTURES): VITD: 60.96 ng/mL (ref 30.00–100.00)

## 2023-02-07 LAB — HEMOGLOBIN A1C: Hgb A1c MFr Bld: 6.6 % — ABNORMAL HIGH (ref 4.6–6.5)

## 2023-02-08 ENCOUNTER — Other Ambulatory Visit: Payer: Self-pay | Admitting: Family

## 2023-02-08 DIAGNOSIS — K219 Gastro-esophageal reflux disease without esophagitis: Secondary | ICD-10-CM

## 2023-02-12 ENCOUNTER — Ambulatory Visit

## 2023-02-12 ENCOUNTER — Other Ambulatory Visit

## 2023-03-07 ENCOUNTER — Telehealth: Payer: Self-pay

## 2023-03-07 DIAGNOSIS — I10 Essential (primary) hypertension: Secondary | ICD-10-CM

## 2023-03-07 NOTE — Telephone Encounter (Signed)
Lisinopril/hydrochlorothiazide 10-12.5mg  is currently unavailable.  See screen shot for recommendations.

## 2023-03-08 MED ORDER — HYDROCHLOROTHIAZIDE 25 MG PO TABS
25.0000 mg | ORAL_TABLET | Freq: Every day | ORAL | 3 refills | Status: DC
Start: 2023-03-08 — End: 2024-03-11

## 2023-03-08 MED ORDER — LISINOPRIL 10 MG PO TABS
10.0000 mg | ORAL_TABLET | Freq: Every day | ORAL | 3 refills | Status: DC
Start: 2023-03-08 — End: 2024-03-11

## 2023-03-08 NOTE — Addendum Note (Signed)
Addended by: Mort Sawyers on: 03/08/2023 07:00 AM   Modules accepted: Orders

## 2023-04-03 ENCOUNTER — Other Ambulatory Visit: Payer: Self-pay | Admitting: Family

## 2023-04-03 DIAGNOSIS — E1165 Type 2 diabetes mellitus with hyperglycemia: Secondary | ICD-10-CM

## 2023-04-25 ENCOUNTER — Other Ambulatory Visit: Payer: Self-pay | Admitting: Family

## 2023-04-25 DIAGNOSIS — E1165 Type 2 diabetes mellitus with hyperglycemia: Secondary | ICD-10-CM

## 2023-05-14 ENCOUNTER — Encounter: Payer: Self-pay | Admitting: *Deleted

## 2023-05-14 ENCOUNTER — Encounter: Payer: Self-pay | Admitting: Family

## 2023-05-14 ENCOUNTER — Ambulatory Visit: Admitting: Family

## 2023-05-14 VITALS — BP 112/84 | HR 67 | Temp 98.1°F | Ht 59.0 in | Wt 161.8 lb

## 2023-05-14 DIAGNOSIS — E538 Deficiency of other specified B group vitamins: Secondary | ICD-10-CM

## 2023-05-14 DIAGNOSIS — Z23 Encounter for immunization: Secondary | ICD-10-CM

## 2023-05-14 DIAGNOSIS — Z7985 Long-term (current) use of injectable non-insulin antidiabetic drugs: Secondary | ICD-10-CM

## 2023-05-14 DIAGNOSIS — E559 Vitamin D deficiency, unspecified: Secondary | ICD-10-CM | POA: Diagnosis not present

## 2023-05-14 DIAGNOSIS — E1169 Type 2 diabetes mellitus with other specified complication: Secondary | ICD-10-CM

## 2023-05-14 DIAGNOSIS — G4733 Obstructive sleep apnea (adult) (pediatric): Secondary | ICD-10-CM | POA: Diagnosis not present

## 2023-05-14 DIAGNOSIS — Z79899 Other long term (current) drug therapy: Secondary | ICD-10-CM | POA: Insufficient documentation

## 2023-05-14 DIAGNOSIS — E1165 Type 2 diabetes mellitus with hyperglycemia: Secondary | ICD-10-CM

## 2023-05-14 DIAGNOSIS — E785 Hyperlipidemia, unspecified: Secondary | ICD-10-CM | POA: Diagnosis not present

## 2023-05-14 LAB — BASIC METABOLIC PANEL
BUN: 9 mg/dL (ref 6–23)
CO2: 31 meq/L (ref 19–32)
Calcium: 9.1 mg/dL (ref 8.4–10.5)
Chloride: 99 meq/L (ref 96–112)
Creatinine, Ser: 0.74 mg/dL (ref 0.40–1.20)
GFR: 88.88 mL/min (ref >60.00)
Glucose, Bld: 101 mg/dL — ABNORMAL HIGH (ref 70–99)
Potassium: 3 meq/L — ABNORMAL LOW (ref 3.5–5.1)
Sodium: 143 meq/L (ref 135–145)

## 2023-05-14 LAB — LIPID PANEL
Cholesterol: 107 mg/dL (ref 0–200)
HDL: 40.4 mg/dL (ref >39.00)
LDL Cholesterol: 50 mg/dL (ref 0–99)
NonHDL: 67.02
Total CHOL/HDL Ratio: 3
Triglycerides: 87 mg/dL (ref 0.0–149.0)
VLDL: 17.4 mg/dL (ref 0.0–40.0)

## 2023-05-14 LAB — VITAMIN D 25 HYDROXY (VIT D DEFICIENCY, FRACTURES): VITD: 35.84 ng/mL (ref 30.00–100.00)

## 2023-05-14 LAB — HEMOGLOBIN A1C: Hgb A1c MFr Bld: 6.2 % (ref 4.6–6.5)

## 2023-05-14 LAB — VITAMIN B12: Vitamin B-12: 390 pg/mL (ref 211–911)

## 2023-05-14 NOTE — Progress Notes (Signed)
Established Patient Office Visit  Subjective:      CC:  Chief Complaint  Patient presents with   Diabetes    HPI: Lauren Avila is a 57 y.o. female presenting on 05/14/2023 for Diabetes . DM2: currently taking ozempic 2 mg weekly as well as jardiance and metformin. Urine microalbumin completed 4/24. Has been going to pilates once a week and working on diet. Fasting glucose average in the am 100 or less. At times at night time gets some nausea and heartburn. She tries not to eat prior to 7 pm. Overdue for eye exam.   Lab Results  Component Value Date   HGBA1C 6.6 (H) 02/07/2023    Hld associated with DM: LDL at /< goal, taking atorvastatin 20 mg once nightly and tolerating well.  Lab Results  Component Value Date   CHOL 92 11/12/2022   HDL 42.60 11/12/2022   LDLCALC 35 11/12/2022   TRIG 75.0 11/12/2022   CHOLHDL 2 11/12/2022   Wt Readings from Last 3 Encounters:  05/14/23 161 lb 12.8 oz (73.4 kg)  11/27/22 188 lb 9.6 oz (85.5 kg)  11/12/22 188 lb 9.6 oz (85.5 kg)   Heartburn, she is on pantoprazole 20 mg once daily. She has been on this for many years and would love to try to come off of this however she often finds she doesn't have heartburn control as well. She has never had an EGD. She initially started with hearburn with sleep apnea.   OSA: she is on cpap and had some weight loss and does think she needs to get her face resized.    Social history:  Relevant past medical, surgical, family and social history reviewed and updated as indicated. Interim medical history since our last visit reviewed.  Allergies and medications reviewed and updated.  DATA REVIEWED: CHART IN EPIC     ROS: Negative unless specifically indicated above in HPI.    Current Outpatient Medications:    amLODipine (NORVASC) 10 MG tablet, Take 1 tablet (10 mg total) by mouth daily., Disp: 90 tablet, Rfl: 3   atorvastatin (LIPITOR) 20 MG tablet, TAKE 1 TABLET BY MOUTH ONCE A DAY, Disp:  90 tablet, Rfl: 3   BIOTIN PO, Take by mouth., Disp: , Rfl:    glucose blood test strip, Use 1 strip daily to check blood sugar, Disp: 100 each, Rfl: 1   hydrochlorothiazide (HYDRODIURIL) 25 MG tablet, Take 1 tablet (25 mg total) by mouth daily., Disp: 90 tablet, Rfl: 3   JARDIANCE 25 MG TABS tablet, TAKE 1 TABLET DAILY BEFORE BREAKFAST, Disp: 90 tablet, Rfl: 3   Lancets (FREESTYLE) lancets, USE 1 LANCET ONCE DAILY TO CHECK BLOOD SUGAR, Disp: 100 each, Rfl: 1   lisinopril (ZESTRIL) 10 MG tablet, Take 1 tablet (10 mg total) by mouth daily., Disp: 90 tablet, Rfl: 3   metFORMIN (GLUCOPHAGE-XR) 500 MG 24 hr tablet, TAKE 2 TABLETS BY MOUTH 2 TIMES DAILY WITH A MEAL, Disp: 360 tablet, Rfl: 3   mupirocin ointment (BACTROBAN) 2 %, Apply twice daily to affected area on scalp until healed, Disp: 22 g, Rfl: 0   nystatin-triamcinolone ointment (MYCOLOG), Apply topically as needed., Disp: , Rfl:    OZEMPIC, 2 MG/DOSE, 8 MG/3ML SOPN, INJECT 2 MG ONCE A WEEK AS DIRECTED, Disp: 9 mL, Rfl: 3   pantoprazole (PROTONIX) 20 MG tablet, TAKE 1 TABLET DAILY, Disp: 90 tablet, Rfl: 3   Respiratory Therapy Supplies (CARETOUCH CPAP & BIPAP HOSE) MISC, by Does not apply route., Disp: ,  Rfl:       Objective:    BP 112/84 (BP Location: Left Arm, Patient Position: Sitting, Cuff Size: Normal)   Pulse 67   Temp 98.1 F (36.7 C) (Oral)   Ht 4\' 11"  (1.499 m)   Wt 161 lb 12.8 oz (73.4 kg)   SpO2 98%   BMI 32.68 kg/m   Wt Readings from Last 3 Encounters:  05/14/23 161 lb 12.8 oz (73.4 kg)  11/27/22 188 lb 9.6 oz (85.5 kg)  11/12/22 188 lb 9.6 oz (85.5 kg)    Physical Exam Constitutional:      General: She is not in acute distress.    Appearance: Normal appearance. She is normal weight. She is not ill-appearing, toxic-appearing or diaphoretic.  HENT:     Head: Normocephalic.  Cardiovascular:     Rate and Rhythm: Normal rate and regular rhythm.  Pulmonary:     Effort: Pulmonary effort is normal.  Musculoskeletal:         General: Normal range of motion.  Neurological:     General: No focal deficit present.     Mental Status: She is alert and oriented to person, place, and time. Mental status is at baseline.  Psychiatric:        Mood and Affect: Mood normal.        Behavior: Behavior normal.        Thought Content: Thought content normal.        Judgment: Judgment normal.         Title   Diabetic Foot Exam - detailed Is there a history of foot ulcer?: No Is there a foot ulcer now?: No Is there swelling?: No Is there elevated skin temperature?: No Is there abnormal foot shape?: No Is there a claw toe deformity?: No Are the toenails long?: No Are the toenails thick?: No Are the toenails ingrown?: No Is the skin thin, fragile, shiny and hairless?": No Normal Range of Motion?: Yes Is there foot or ankle muscle weakness?: No Do you have pain in calf while walking?: No Are the shoes appropriate in style and fit?: Yes Can the patient see the bottom of their feet?: Yes Pulse Foot Exam completed.: Yes   Right Posterior Tibialis: Present Left posterior Tibialis: Present   Right Dorsalis Pedis: Present Left Dorsalis Pedis: Present     Sensory Foot Exam Completed.: Yes Semmes-Weinstein Monofilament Test "+" means "has sensation" and "-" means "no sensation"  R Foot Test Control: Pos L Foot Test Control: Pos   R Site 1-Great Toe: Pos L Site 1-Great Toe: Pos   R Site 4: Pos L Site 4: Pos   R site 5: Pos L Site 5: Pos  R Site 6: Pos L Site 6: Pos     Image components are not supported.   Image components are not supported. Image components are not supported.  [place] Comments       Assessment & Plan:  Encounter for immunization -     Flu vaccine trivalent PF, 6mos and older(Flulaval,Afluria,Fluarix,Fluzone)  Type 2 diabetes mellitus with hyperglycemia, without long-term current use of insulin (HCC) Assessment & Plan: Ordered hga1c today pending results. Work on diabetic  diet and exercise as tolerated. Yearly foot exam, and annual eye exam.  Foot exam in office today unremarkable. Doing well with weight loss. Continue with ozempic 2 mg once weekly.  Continue jardiance 25 mg once daily as well as metformin 500 mg XR twice daily.  Orders: -  Hemoglobin A1c -     Basic metabolic panel  Long-term current use of proton pump inhibitor therapy  OSA (obstructive sleep apnea) -     Ambulatory referral to Sleep Studies  Vitamin D deficiency -     VITAMIN D 25 Hydroxy (Vit-D Deficiency, Fractures)  Hyperlipidemia associated with type 2 diabetes mellitus (HCC) -     Lipid panel  Low serum vitamin B12 -     Vitamin B12     Return in about 6 months (around 11/12/2023) for f/u CPE.  Mort Sawyers, MSN, APRN, FNP-C [facility]

## 2023-05-14 NOTE — Assessment & Plan Note (Addendum)
Ordered hga1c today pending results. Work on diabetic diet and exercise as tolerated. Yearly foot exam, and annual eye exam.  Foot exam in office today unremarkable. Doing well with weight loss. Continue with ozempic 2 mg once weekly.  Continue jardiance 25 mg once daily as well as metformin 500 mg XR twice daily.

## 2023-05-14 NOTE — Patient Instructions (Signed)
  A referral was placed today for sleep study.  Please let us know if you have not heard back within 2 weeks about the referral.  Stop by the lab prior to leaving today. I will notify you of your results once received.

## 2023-05-15 ENCOUNTER — Other Ambulatory Visit: Payer: Self-pay | Admitting: Family

## 2023-05-15 ENCOUNTER — Other Ambulatory Visit: Payer: Self-pay

## 2023-05-15 ENCOUNTER — Other Ambulatory Visit (HOSPITAL_COMMUNITY): Payer: Self-pay

## 2023-05-15 ENCOUNTER — Encounter: Payer: Self-pay | Admitting: Family

## 2023-05-15 DIAGNOSIS — E876 Hypokalemia: Secondary | ICD-10-CM

## 2023-05-15 MED ORDER — POTASSIUM CHLORIDE CRYS ER 10 MEQ PO TBCR
10.0000 meq | EXTENDED_RELEASE_TABLET | Freq: Two times a day (BID) | ORAL | 0 refills | Status: DC
Start: 2023-05-15 — End: 2023-05-16
  Filled 2023-05-15: qty 60, 30d supply, fill #0

## 2023-05-15 NOTE — Addendum Note (Signed)
Addended by: Alvina Chou on: 05/15/2023 09:14 AM   Modules accepted: Orders

## 2023-05-16 MED ORDER — POTASSIUM CHLORIDE CRYS ER 10 MEQ PO TBCR
10.0000 meq | EXTENDED_RELEASE_TABLET | Freq: Two times a day (BID) | ORAL | 0 refills | Status: DC
Start: 2023-05-16 — End: 2023-11-13

## 2023-06-07 ENCOUNTER — Other Ambulatory Visit (INDEPENDENT_AMBULATORY_CARE_PROVIDER_SITE_OTHER)

## 2023-06-07 DIAGNOSIS — T502X5A Adverse effect of carbonic-anhydrase inhibitors, benzothiadiazides and other diuretics, initial encounter: Secondary | ICD-10-CM

## 2023-06-07 DIAGNOSIS — E876 Hypokalemia: Secondary | ICD-10-CM | POA: Diagnosis not present

## 2023-06-07 LAB — BASIC METABOLIC PANEL
BUN: 9 mg/dL (ref 6–23)
CO2: 33 meq/L — ABNORMAL HIGH (ref 19–32)
Calcium: 9.8 mg/dL (ref 8.4–10.5)
Chloride: 99 meq/L (ref 96–112)
Creatinine, Ser: 0.8 mg/dL (ref 0.40–1.20)
GFR: 80.9 mL/min (ref >60.00)
Glucose, Bld: 161 mg/dL — ABNORMAL HIGH (ref 70–99)
Potassium: 3.3 meq/L — ABNORMAL LOW (ref 3.5–5.1)
Sodium: 142 meq/L (ref 135–145)

## 2023-06-10 ENCOUNTER — Encounter: Payer: Self-pay | Admitting: Family

## 2023-06-10 NOTE — Progress Notes (Signed)
Messsage sent via mychart to ensure pt has been taking her potassium.

## 2023-06-21 ENCOUNTER — Encounter: Payer: Self-pay | Admitting: Family

## 2023-06-26 ENCOUNTER — Other Ambulatory Visit: Payer: Self-pay | Admitting: Family

## 2023-06-26 DIAGNOSIS — E1165 Type 2 diabetes mellitus with hyperglycemia: Secondary | ICD-10-CM

## 2023-07-01 ENCOUNTER — Other Ambulatory Visit: Payer: Self-pay | Admitting: Family

## 2023-07-01 DIAGNOSIS — E1165 Type 2 diabetes mellitus with hyperglycemia: Secondary | ICD-10-CM

## 2023-07-01 MED ORDER — ATORVASTATIN CALCIUM 20 MG PO TABS
ORAL_TABLET | Freq: Every day | ORAL | 3 refills | Status: DC
Start: 2023-07-01 — End: 2023-07-01

## 2023-07-03 ENCOUNTER — Other Ambulatory Visit: Payer: Self-pay | Admitting: Family

## 2023-07-03 DIAGNOSIS — E1165 Type 2 diabetes mellitus with hyperglycemia: Secondary | ICD-10-CM

## 2023-08-26 LAB — HM DIABETES EYE EXAM

## 2023-09-03 ENCOUNTER — Encounter: Payer: Self-pay | Admitting: Family

## 2023-09-03 NOTE — Telephone Encounter (Signed)
That is hard to give an answer for but if rattling in her chest and feeling sob then she should be seen.   If feeling better day by day and not hard to breathe she can wait and see dependent on symptoms.   Typically a virus can last 7-10 days

## 2023-09-03 NOTE — Telephone Encounter (Signed)
Called and spoke with pt. She is aware of Tabitha's response. States that she is going to give it a few more days and if she is not improved she will call to get an appointment.

## 2023-10-09 NOTE — Progress Notes (Signed)
 noted

## 2023-10-22 ENCOUNTER — Encounter

## 2023-11-04 ENCOUNTER — Other Ambulatory Visit: Payer: Self-pay | Admitting: Family

## 2023-11-04 DIAGNOSIS — Z0000 Encounter for general adult medical examination without abnormal findings: Secondary | ICD-10-CM

## 2023-11-13 ENCOUNTER — Telehealth: Payer: Self-pay | Admitting: Family

## 2023-11-13 ENCOUNTER — Ambulatory Visit: Admitting: Family

## 2023-11-13 ENCOUNTER — Encounter: Payer: Self-pay | Admitting: Family

## 2023-11-13 VITALS — BP 132/70 | HR 77 | Temp 98.2°F | Ht 59.0 in | Wt 151.6 lb

## 2023-11-13 DIAGNOSIS — E538 Deficiency of other specified B group vitamins: Secondary | ICD-10-CM

## 2023-11-13 DIAGNOSIS — I10 Essential (primary) hypertension: Secondary | ICD-10-CM

## 2023-11-13 DIAGNOSIS — Z6830 Body mass index (BMI) 30.0-30.9, adult: Secondary | ICD-10-CM

## 2023-11-13 DIAGNOSIS — E559 Vitamin D deficiency, unspecified: Secondary | ICD-10-CM

## 2023-11-13 DIAGNOSIS — E785 Hyperlipidemia, unspecified: Secondary | ICD-10-CM

## 2023-11-13 DIAGNOSIS — E1165 Type 2 diabetes mellitus with hyperglycemia: Secondary | ICD-10-CM | POA: Diagnosis not present

## 2023-11-13 DIAGNOSIS — G4733 Obstructive sleep apnea (adult) (pediatric): Secondary | ICD-10-CM

## 2023-11-13 DIAGNOSIS — Z0000 Encounter for general adult medical examination without abnormal findings: Secondary | ICD-10-CM | POA: Diagnosis not present

## 2023-11-13 DIAGNOSIS — E669 Obesity, unspecified: Secondary | ICD-10-CM

## 2023-11-13 DIAGNOSIS — Z23 Encounter for immunization: Secondary | ICD-10-CM

## 2023-11-13 DIAGNOSIS — E1169 Type 2 diabetes mellitus with other specified complication: Secondary | ICD-10-CM

## 2023-11-13 DIAGNOSIS — Z7985 Long-term (current) use of injectable non-insulin antidiabetic drugs: Secondary | ICD-10-CM

## 2023-11-13 DIAGNOSIS — Z79899 Other long term (current) drug therapy: Secondary | ICD-10-CM

## 2023-11-13 LAB — COMPREHENSIVE METABOLIC PANEL WITH GFR
ALT: 24 U/L (ref 0–35)
AST: 18 U/L (ref 0–37)
Albumin: 4.2 g/dL (ref 3.5–5.2)
Alkaline Phosphatase: 81 U/L (ref 39–117)
BUN: 14 mg/dL (ref 6–23)
CO2: 30 meq/L (ref 19–32)
Calcium: 9.9 mg/dL (ref 8.4–10.5)
Chloride: 101 meq/L (ref 96–112)
Creatinine, Ser: 0.85 mg/dL (ref 0.40–1.20)
GFR: 75 mL/min (ref >60.00)
Glucose, Bld: 84 mg/dL (ref 70–99)
Potassium: 3.9 meq/L (ref 3.5–5.1)
Sodium: 142 meq/L (ref 135–145)
Total Bilirubin: 0.7 mg/dL (ref 0.2–1.2)
Total Protein: 7.7 g/dL (ref 6.0–8.3)

## 2023-11-13 LAB — LIPID PANEL
Cholesterol: 110 mg/dL (ref 0–200)
HDL: 47.2 mg/dL (ref >39.00)
LDL Cholesterol: 47 mg/dL (ref 0–99)
NonHDL: 62.74
Total CHOL/HDL Ratio: 2
Triglycerides: 80 mg/dL (ref 0.0–149.0)
VLDL: 16 mg/dL (ref 0.0–40.0)

## 2023-11-13 LAB — MICROALBUMIN / CREATININE URINE RATIO
Creatinine,U: 112.6 mg/dL
Microalb Creat Ratio: UNDETERMINED mg/g (ref 0.0–30.0)
Microalb, Ur: <0.7 mg/dL

## 2023-11-13 LAB — VITAMIN B12: Vitamin B-12: 337 pg/mL (ref 211–911)

## 2023-11-13 LAB — CBC
HCT: 41.4 % (ref 36.0–46.0)
Hemoglobin: 13.5 g/dL (ref 12.0–15.0)
MCHC: 32.5 g/dL (ref 30.0–36.0)
MCV: 86.2 fl (ref 78.0–100.0)
Platelets: 358 10*3/uL (ref 150.0–400.0)
RBC: 4.81 Mil/uL (ref 3.87–5.11)
RDW: 14.6 % (ref 11.5–15.5)
WBC: 5.6 10*3/uL (ref 4.0–10.5)

## 2023-11-13 LAB — HEMOGLOBIN A1C: Hgb A1c MFr Bld: 5.8 % (ref 4.6–6.5)

## 2023-11-13 LAB — VITAMIN D 25 HYDROXY (VIT D DEFICIENCY, FRACTURES): VITD: 22.2 ng/mL — ABNORMAL LOW (ref 30.00–100.00)

## 2023-11-13 MED ORDER — OZEMPIC (2 MG/DOSE) 8 MG/3ML ~~LOC~~ SOPN
2.0000 mg | PEN_INJECTOR | SUBCUTANEOUS | 3 refills | Status: AC
Start: 2023-11-13 — End: ?

## 2023-11-13 NOTE — Addendum Note (Signed)
 Addended by: Cintia Gleed on: 11/13/2023 10:22 AM   Modules accepted: Orders

## 2023-11-13 NOTE — Assessment & Plan Note (Signed)
 Ordered vitamin d pending results.

## 2023-11-13 NOTE — Assessment & Plan Note (Signed)
continue lipitor 20 mg  Ordered lipid panel, pending results. Work on low cholesterol diet and exercise as tolerated

## 2023-11-13 NOTE — Assessment & Plan Note (Signed)
 Ordered hga1c today pending results. Work on diabetic diet and exercise as tolerated. Yearly foot exam in office today and annual eye exam.  Foot exam in office today unremarkable. Doing well with weight loss. Continue with ozempic 2 mg once weekly.  Continue jardiance 25 mg once daily as well as metformin 500 mg XR twice daily.

## 2023-11-13 NOTE — Progress Notes (Signed)
 Subjective:  Patient ID: Lauren Avila, female    DOB: [birth year]  Age: 58 y.o. MRN: [id number]  Patient Care Team: Mort Sawyers, FNP as PCP - General (Family Medicine) Geryl Rankins, MD as Consulting Physician (Obstetrics and Gynecology)   CC:  Chief Complaint  Patient presents with   Annual Exam    HPI Lauren Avila is a 58 y.o. female who presents today for an annual physical exam. She reports consuming a general diet.  Pilates once weekly and active around her house  She generally feels well. She reports sleeping well. She does not have additional problems to discuss today.   Vision:Within last year Dental:Receives regular dental care  Mammogram: 11/28/22 Last pap: 09/16/2018 , has appt with Dr. Connye Burkitt GYN 10/2023  Colonoscopy:every ten years, last one was 08/11/2015 Bone density scan: < 65  Pt is without acute concerns.   Wt Readings from Last 3 Encounters:  11/13/23 151 lb 9.6 oz (68.8 kg)  05/14/23 161 lb 12.8 oz (73.4 kg)  11/27/22 188 lb 9.6 oz (85.5 kg)    DM2: on jardiance 25 mg once daily as well as ozempic 2 mg weekly  and metformin   HTN: on hydrochlorothiazide and amlodipine 10 mg once daily and lisinopril 10 mg  No currently taking potassium however we will repeat today was on 10 meq twice daily.    Advanced Directives Patient does not have advanced directives    DEPRESSION SCREENING    11/13/2023    9:19 AM 11/12/2022    8:03 AM 02/08/2022    8:45 AM 12/27/2020    4:28 PM 10/23/2019    8:12 AM  PHQ 2/9 Scores  PHQ - 2 Score 0 0 0 0 0     ROS: Negative unless specifically indicated above in HPI.    Current Outpatient Medications:    amLODipine (NORVASC) 10 MG tablet, Take 1 tablet (10 mg total) by mouth daily., Disp: 90 tablet, Rfl: 3   atorvastatin (LIPITOR) 20 MG tablet, TAKE 1 TABLET DAILY, Disp: 90 tablet, Rfl: 3   BIOTIN PO, Take by mouth., Disp: , Rfl:    glucose blood test strip, Use 1 strip daily to check blood sugar, Disp: 100 each,  Rfl: 1   hydrochlorothiazide (HYDRODIURIL) 25 MG tablet, Take 1 tablet (25 mg total) by mouth daily., Disp: 90 tablet, Rfl: 3   JARDIANCE 25 MG TABS tablet, TAKE 1 TABLET DAILY BEFORE BREAKFAST, Disp: 90 tablet, Rfl: 3   Lancets (FREESTYLE) lancets, USE 1 LANCET ONCE DAILY TO CHECK BLOOD SUGAR, Disp: 100 each, Rfl: 1   lisinopril (ZESTRIL) 10 MG tablet, Take 1 tablet (10 mg total) by mouth daily., Disp: 90 tablet, Rfl: 3   metFORMIN (GLUCOPHAGE-XR) 500 MG 24 hr tablet, TAKE 2 TABLETS TWICE A DAY WITH MEALS, Disp: 360 tablet, Rfl: 3   mupirocin ointment (BACTROBAN) 2 %, Apply twice daily to affected area on scalp until healed, Disp: 22 g, Rfl: 0   nystatin-triamcinolone ointment (MYCOLOG), Apply topically as needed., Disp: , Rfl:    pantoprazole (PROTONIX) 20 MG tablet, TAKE 1 TABLET DAILY, Disp: 90 tablet, Rfl: 3   Respiratory Therapy Supplies (CARETOUCH CPAP & BIPAP HOSE) MISC, by Does not apply route., Disp: , Rfl:    Semaglutide, 2 MG/DOSE, (OZEMPIC, 2 MG/DOSE,) 8 MG/3ML SOPN, Inject 2 mg into the skin once a week., Disp: 9 mL, Rfl: 3    Objective:    BP 132/70   Pulse 77   Temp 98.2 F (36.8  C) (Oral)   Ht 4\' 11"  (1.499 m)   Wt 151 lb 9.6 oz (68.8 kg)   SpO2 98%   BMI 30.62 kg/m   BP Readings from Last 3 Encounters:  11/13/23 132/70  05/14/23 112/84  11/27/22 131/84      Physical Exam Constitutional:      General: She is not in acute distress.    Appearance: Normal appearance. She is normal weight. She is not ill-appearing.  HENT:     Head: Normocephalic.     Right Ear: Tympanic membrane normal.     Left Ear: Tympanic membrane normal.     Nose: Nose normal.     Mouth/Throat:     Mouth: Mucous membranes are moist.  Eyes:     Extraocular Movements: Extraocular movements intact.     Pupils: Pupils are equal, round, and reactive to light.  Cardiovascular:     Rate and Rhythm: Normal rate and regular rhythm.  Pulmonary:     Effort: Pulmonary effort is normal.      Breath sounds: Normal breath sounds.  Abdominal:     General: Abdomen is flat. Bowel sounds are normal.     Palpations: Abdomen is soft.     Tenderness: There is no guarding or rebound.  Musculoskeletal:        General: Normal range of motion.     Cervical back: Normal range of motion.  Skin:    General: Skin is warm.     Capillary Refill: Capillary refill takes less than 2 seconds.  Neurological:     General: No focal deficit present.     Mental Status: She is alert.  Psychiatric:        Mood and Affect: Mood normal.        Behavior: Behavior normal.        Thought Content: Thought content normal.        Judgment: Judgment normal.    Title   Diabetic Foot Exam - detailed Is there a history of foot ulcer?: No Is there a foot ulcer now?: No Is there swelling?: No Is there elevated skin temperature?: No Is there abnormal foot shape?: No Is there a claw toe deformity?: No Are the toenails long?: No Are the toenails thick?: No Are the toenails ingrown?: No Is the skin thin, fragile, shiny and hairless?": No Normal Range of Motion?: Yes Is there foot or ankle muscle weakness?: No Do you have pain in calf while walking?: No Are the shoes appropriate in style and fit?: Yes Can the patient see the bottom of their feet?: Yes Pulse Foot Exam completed.: Yes   Right Posterior Tibialis: Present Left posterior Tibialis: Present   Right Dorsalis Pedis: Present Left Dorsalis Pedis: Present     Semmes-Weinstein Monofilament Test "+" means "has sensation" and "-" means "no sensation"  R Foot Test Control: Pos L Foot Test Control: Pos   R Site 1-Great Toe: Pos L Site 1-Great Toe: Pos   R Site 4: Pos L Site 4: Pos   R site 5: Pos L Site 5: Pos  R Site 6: Pos L Site 6: Pos     Image components are not supported.   Image components are not supported. Image components are not supported.  [place] Comments           Assessment & Plan:  Encounter for general adult  medical examination without abnormal findings -     Lipid panel -     CBC -  Microalbumin / creatinine urine ratio -     Comprehensive metabolic panel with GFR  Type 2 diabetes mellitus with hyperglycemia, without long-term current use of insulin (HCC) Assessment & Plan: Ordered hga1c today pending results. Work on diabetic diet and exercise as tolerated. Yearly foot exam in office today and annual eye exam.  Foot exam in office today unremarkable. Doing well with weight loss. Continue with ozempic 2 mg once weekly.  Continue jardiance 25 mg once daily as well as metformin 500 mg XR twice daily.  Orders: -     Ozempic (2 MG/DOSE); Inject 2 mg into the skin once a week.  Dispense: 9 mL; Refill: 3 -     Hemoglobin A1c -     Microalbumin / creatinine urine ratio -     Comprehensive metabolic panel with GFR  On statin therapy -     Lipid panel  Low serum vitamin B12 -     Vitamin B12  Vitamin D deficiency Assessment & Plan: Ordered vitamin d pending results.    Orders: -     VITAMIN D 25 Hydroxy (Vit-D Deficiency, Fractures)  Obesity (BMI 30-39.9) Assessment & Plan: Pt advised to work on diet and exercise as tolerated    Essential hypertension Assessment & Plan: Stable Cont meds as prescribed  Orders: -     Microalbumin / creatinine urine ratio  Mild obstructive sleep apnea Assessment & Plan: Compliant    Hyperlipidemia associated with type 2 diabetes mellitus (HCC) Assessment & Plan: continue lipitor 20 mg  Ordered lipid panel, pending results. Work on low cholesterol diet and exercise as tolerated        Follow-up: Return in about 6 months (around 05/14/2024) for f/u diabetes.   Felicita Horns, FNP

## 2023-11-13 NOTE — Assessment & Plan Note (Signed)
 Stable Cont meds as prescribed

## 2023-11-13 NOTE — Assessment & Plan Note (Signed)
Compliant 

## 2023-11-13 NOTE — Assessment & Plan Note (Signed)
 Pt advised to work on diet and exercise as tolerated

## 2023-11-14 ENCOUNTER — Other Ambulatory Visit: Payer: Self-pay | Admitting: Family

## 2023-11-14 ENCOUNTER — Other Ambulatory Visit (HOSPITAL_COMMUNITY): Payer: Self-pay

## 2023-11-14 ENCOUNTER — Other Ambulatory Visit: Payer: Self-pay

## 2023-11-14 ENCOUNTER — Encounter: Payer: Self-pay | Admitting: Family

## 2023-11-14 DIAGNOSIS — E559 Vitamin D deficiency, unspecified: Secondary | ICD-10-CM

## 2023-11-14 MED ORDER — CHOLECALCIFEROL 1.25 MG (50000 UT) PO CAPS
50000.0000 [IU] | ORAL_CAPSULE | ORAL | 0 refills | Status: AC
Start: 2023-11-14 — End: ?
  Filled 2023-11-14: qty 8, 56d supply, fill #0

## 2023-11-19 NOTE — Telephone Encounter (Signed)
 erroneous

## 2023-11-27 ENCOUNTER — Other Ambulatory Visit: Payer: Self-pay | Admitting: Obstetrics and Gynecology

## 2023-11-27 DIAGNOSIS — N63 Unspecified lump in unspecified breast: Secondary | ICD-10-CM

## 2023-12-03 ENCOUNTER — Encounter

## 2023-12-10 ENCOUNTER — Encounter

## 2023-12-25 ENCOUNTER — Other Ambulatory Visit

## 2023-12-25 ENCOUNTER — Ambulatory Visit
Admission: RE | Admit: 2023-12-25 | Discharge: 2023-12-25 | Disposition: A | Discharge status: Home / Self Care | Source: Ambulatory Visit | Attending: Obstetrics and Gynecology

## 2023-12-25 ENCOUNTER — Encounter

## 2023-12-25 DIAGNOSIS — N63 Unspecified lump in unspecified breast: Secondary | ICD-10-CM

## 2024-01-29 ENCOUNTER — Other Ambulatory Visit: Payer: Self-pay | Admitting: Family

## 2024-01-29 DIAGNOSIS — E1165 Type 2 diabetes mellitus with hyperglycemia: Secondary | ICD-10-CM

## 2024-02-24 ENCOUNTER — Other Ambulatory Visit: Payer: Self-pay | Admitting: Family

## 2024-02-24 DIAGNOSIS — K219 Gastro-esophageal reflux disease without esophagitis: Secondary | ICD-10-CM

## 2024-03-11 ENCOUNTER — Other Ambulatory Visit: Payer: Self-pay | Admitting: Family

## 2024-03-11 DIAGNOSIS — I10 Essential (primary) hypertension: Secondary | ICD-10-CM

## 2024-04-21 ENCOUNTER — Other Ambulatory Visit: Payer: Self-pay | Admitting: Family

## 2024-04-21 DIAGNOSIS — E1165 Type 2 diabetes mellitus with hyperglycemia: Secondary | ICD-10-CM

## 2024-05-13 ENCOUNTER — Ambulatory Visit: Admitting: Family

## 2024-05-18 ENCOUNTER — Other Ambulatory Visit: Payer: Self-pay | Admitting: Family

## 2024-05-18 DIAGNOSIS — E1165 Type 2 diabetes mellitus with hyperglycemia: Secondary | ICD-10-CM

## 2024-05-21 ENCOUNTER — Ambulatory Visit: Admitting: Family

## 2024-05-21 ENCOUNTER — Encounter: Payer: Self-pay | Admitting: Family

## 2024-05-21 VITALS — BP 116/74 | HR 86 | Temp 98.4°F | Ht 59.0 in | Wt 146.0 lb

## 2024-05-21 DIAGNOSIS — Z7984 Long term (current) use of oral hypoglycemic drugs: Secondary | ICD-10-CM

## 2024-05-21 DIAGNOSIS — E1169 Type 2 diabetes mellitus with other specified complication: Secondary | ICD-10-CM | POA: Diagnosis not present

## 2024-05-21 DIAGNOSIS — E538 Deficiency of other specified B group vitamins: Secondary | ICD-10-CM | POA: Diagnosis not present

## 2024-05-21 DIAGNOSIS — E559 Vitamin D deficiency, unspecified: Secondary | ICD-10-CM

## 2024-05-21 DIAGNOSIS — J301 Allergic rhinitis due to pollen: Secondary | ICD-10-CM

## 2024-05-21 DIAGNOSIS — E1165 Type 2 diabetes mellitus with hyperglycemia: Secondary | ICD-10-CM

## 2024-05-21 DIAGNOSIS — I10 Essential (primary) hypertension: Secondary | ICD-10-CM

## 2024-05-21 DIAGNOSIS — L989 Disorder of the skin and subcutaneous tissue, unspecified: Secondary | ICD-10-CM

## 2024-05-21 DIAGNOSIS — G4733 Obstructive sleep apnea (adult) (pediatric): Secondary | ICD-10-CM

## 2024-05-21 DIAGNOSIS — E785 Hyperlipidemia, unspecified: Secondary | ICD-10-CM

## 2024-05-21 DIAGNOSIS — R442 Other hallucinations: Secondary | ICD-10-CM

## 2024-05-21 DIAGNOSIS — Z23 Encounter for immunization: Secondary | ICD-10-CM | POA: Diagnosis not present

## 2024-05-21 LAB — LIPID PANEL
Cholesterol: 115 mg/dL (ref 0–200)
HDL: 53.2 mg/dL (ref >39.00)
LDL Cholesterol: 51 mg/dL (ref 0–99)
NonHDL: 61.96
Total CHOL/HDL Ratio: 2
Triglycerides: 53 mg/dL (ref 0.0–149.0)
VLDL: 10.6 mg/dL (ref 0.0–40.0)

## 2024-05-21 LAB — COMPREHENSIVE METABOLIC PANEL WITH GFR
ALT: 19 U/L (ref 0–35)
AST: 18 U/L (ref 0–37)
Albumin: 4.2 g/dL (ref 3.5–5.2)
Alkaline Phosphatase: 72 U/L (ref 39–117)
BUN: 12 mg/dL (ref 6–23)
CO2: 31 meq/L (ref 19–32)
Calcium: 9.5 mg/dL (ref 8.4–10.5)
Chloride: 100 meq/L (ref 96–112)
Creatinine, Ser: 0.95 mg/dL (ref 0.40–1.20)
GFR: 65.39 mL/min (ref >60.00)
Glucose, Bld: 87 mg/dL (ref 70–99)
Potassium: 3.6 meq/L (ref 3.5–5.1)
Sodium: 141 meq/L (ref 135–145)
Total Bilirubin: 0.5 mg/dL (ref 0.2–1.2)
Total Protein: 7.6 g/dL (ref 6.0–8.3)

## 2024-05-21 LAB — VITAMIN D 25 HYDROXY (VIT D DEFICIENCY, FRACTURES): VITD: 42.26 ng/mL (ref 30.00–100.00)

## 2024-05-21 LAB — VITAMIN B12: Vitamin B-12: 383 pg/mL (ref 211–911)

## 2024-05-21 LAB — HEMOGLOBIN A1C: Hgb A1c MFr Bld: 5.9 % (ref 4.6–6.5)

## 2024-05-21 MED ORDER — FLUTICASONE PROPIONATE 50 MCG/ACT NA SUSP: 2.0000 | Freq: Every day | NASAL | 6 refills | Status: AC

## 2024-05-21 NOTE — Progress Notes (Signed)
 Established Patient Office Visit  Subjective:      CC:  Chief Complaint  Patient presents with   Medical Management of Chronic Issues    HPI: Lauren Avila is a 58 y.o. female presenting on 05/21/2024 for Medical Management of Chronic Issues .  Discussed the use of AI scribe software for clinical note transcription with the patient, who gave verbal consent to proceed.  History of Present Illness Lauren Avila is a 58 year old female who presents for a routine follow-up visit.  She has been feeling well over the past six months, enjoying her retirement and traveling. She takes vitamin D3, 2000 IU, once or twice a week after completing a prescribed course for low vitamin D  levels. Her previous vitamin D  level was 24 ng/mL. She uses a CPAP machine, which helps maintain her energy levels.  She takes Claritin for allergies, which she associates more with congestion than sneezing or a runny nose. She experiences a persistent ammonia smell, particularly on days with low water  intake. Previously, she drank four to five full [place] of water  daily but has reduced her intake due to feeling full. The ammonia smell is not frequent but noticeable on days following low water  intake.  She mentions a lesion on her chest, evaluated during a mammogram and deemed non-concerning. She is considering [facility] for further evaluation.  She occasionally wakes up in the middle of the night and vomits, though she does not experience frequent acid reflux. She tries not to eat within two hours of bedtime and has a chair in her room to avoid lying down after eating. The vomiting is infrequent and sporadic.  She is currently taking metformin , 2000 mg, and inquires about the possibility of reducing her medication dosage. She takes B12 supplements as recommended, though she does not recall the exact dosage. She denies exposure to tobacco and does not experience burning mouth syndrome.  She has  been using Claritin for a long time and is considering switching to another antihistamine.         Social history:  Relevant past medical, surgical, family and social history reviewed and updated as indicated. Interim medical history since our last visit reviewed.  Allergies and medications reviewed and updated.  DATA REVIEWED: CHART IN EPIC     ROS: Negative unless specifically indicated above in HPI.    Current Outpatient Medications:    amLODipine  (NORVASC ) 10 MG tablet, TAKE 1 TABLET DAILY, Disp: 90 tablet, Rfl: 3   atorvastatin  (LIPITOR) 20 MG tablet, TAKE 1 TABLET DAILY, Disp: 90 tablet, Rfl: 3   BIOTIN PO, Take by mouth., Disp: , Rfl:    Cholecalciferol  1.25 MG (50000 UT) capsule, Take 1 capsule (50,000 Units total) by mouth once a week., Disp: 8 capsule, Rfl: 0   empagliflozin  (JARDIANCE ) 25 MG TABS tablet, TAKE 1 TABLET DAILY BEFORE BREAKFAST, Disp: 90 tablet, Rfl: 1   glucose blood test strip, Use 1 strip daily to check blood sugar, Disp: 100 each, Rfl: 1   hydrochlorothiazide  (HYDRODIURIL ) 25 MG tablet, TAKE 1 TABLET DAILY, Disp: 90 tablet, Rfl: 3   Lancets (FREESTYLE) lancets, USE 1 LANCET ONCE DAILY TO CHECK BLOOD SUGAR, Disp: 100 each, Rfl: 1   lisinopril  (ZESTRIL ) 10 MG tablet, TAKE 1 TABLET DAILY, Disp: 90 tablet, Rfl: 3   metFORMIN  (GLUCOPHAGE -XR) 500 MG 24 hr tablet, TAKE 2 TABLETS TWICE A DAY WITH MEALS, Disp: 360 tablet, Rfl: 3   mupirocin  ointment (BACTROBAN ) 2 %, Apply twice daily  to affected area on scalp until healed, Disp: 22 g, Rfl: 0   nystatin-triamcinolone ointment (MYCOLOG), Apply topically as needed., Disp: , Rfl:    pantoprazole  (PROTONIX ) 20 MG tablet, TAKE 1 TABLET DAILY, Disp: 90 tablet, Rfl: 1   Respiratory Therapy Supplies (CARETOUCH CPAP & BIPAP HOSE) MISC, by Does not apply route., Disp: , Rfl:    Semaglutide , 2 MG/DOSE, (OZEMPIC , 2 MG/DOSE,) 8 MG/3ML SOPN, Inject 2 mg into the skin once a week., Disp: 9 mL, Rfl: 3        Objective:         BP 116/74 (BP Location: Left Arm, Patient Position: Sitting, Cuff Size: Normal)   Pulse 86   Temp 98.4 F (36.9 C) (Temporal)   Ht 4' 11 (1.499 m)   Wt 146 lb (66.2 kg)   SpO2 99%   BMI 29.49 kg/m   Physical Exam HEENT: Turbinates swollen.  Wt Readings from Last 3 Encounters:  05/21/24 146 lb (66.2 kg)  11/13/23 151 lb 9.6 oz (68.8 kg)  05/14/23 161 lb 12.8 oz (73.4 kg)    Physical Exam Vitals reviewed.  Constitutional:      General: She is not in acute distress.    Appearance: Normal appearance. She is normal weight. She is not ill-appearing, toxic-appearing or diaphoretic.  HENT:     Head: Normocephalic.  Cardiovascular:     Rate and Rhythm: Normal rate and regular rhythm.  Pulmonary:     Effort: Pulmonary effort is normal.  Musculoskeletal:        General: Normal range of motion.  Neurological:     General: No focal deficit present.     Mental Status: She is alert and oriented to person, place, and time. Mental status is at baseline.  Psychiatric:        Mood and Affect: Mood normal.        Behavior: Behavior normal.        Thought Content: Thought content normal.        Judgment: Judgment normal.          Results LABS Vitamin D : 24 ng/mL  Assessment & Plan:   Assessment and Plan Assessment & Plan Type 2 diabetes mellitus with hyperglycemia and mixed hyperlipidemia Diabetes is well-managed with A1c in a good range, indicating prediabetic status. The goal is to maintain or improve this status. - Monitor A1c levels - Consider reducing metformin  dosage if diabetes remains well-controlled  Essential hypertension  Obesity (BMI 30-39.9)  Mild obstructive sleep apnea Regular CPAP use is beneficial for managing sleep apnea and maintaining energy levels.  Vitamin D  deficiency Previous vitamin D  levels were low at 24, with a target of over 30, ideally 40-60. Regular supplementation is needed to maintain adequate levels. - Check vitamin D   levels during upcoming labs - Encourage regular vitamin D  supplementation, ideally daily  Low serum vitamin B12 She is taking B12 supplements as previously advised. - Repeat B12 levels during upcoming labs  Allergic rhinitis with nasal turbinate hypertrophy Nasal congestion and occasional ammonia smell are possibly related to allergies. Examination revealed swollen nasal turbinates, indicating allergic rhinitis. - Start Flonase nasal spray - Switch from Claritin to another antihistamine like Zyrtec or Allegra - Monitor symptoms and reassess if no improvement  Alopecia She prefers not to use minoxidil and is dissatisfied with previous dermatological management. - Refer to [facility] for further evaluation and management  Stable epidermoid/dermoid cyst of chest wall She has a cyst on the chest wall, previously  evaluated and deemed non-problematic. - Refer to [facility] for evaluation and potential removal of the cyst  General Health Maintenance She is due for routine labs and flu vaccination. Adequate hydration is encouraged to prevent ammonia smell, possibly related to dehydration. - Administer flu shot - Encourage adequate hydration - Perform routine labs including kidney function, lipid panel, A1c, and vitamin D  levels   Lab Results  Component Value Date   HGBA1C 5.8 11/13/2023        Return in about 6 months (around 11/19/2024) for f/u CPE.     Ginger Patrick, MSN, APRN, FNP-C [facility]

## 2024-05-22 ENCOUNTER — Ambulatory Visit: Payer: Self-pay | Admitting: Family

## 2024-07-10 ENCOUNTER — Other Ambulatory Visit: Payer: Self-pay | Admitting: Family

## 2024-07-10 DIAGNOSIS — E1165 Type 2 diabetes mellitus with hyperglycemia: Secondary | ICD-10-CM

## 2024-08-29 ENCOUNTER — Other Ambulatory Visit: Payer: Self-pay | Admitting: Family

## 2024-08-29 DIAGNOSIS — K219 Gastro-esophageal reflux disease without esophagitis: Secondary | ICD-10-CM

## 2024-11-17 ENCOUNTER — Encounter: Admitting: Family

## 2025-01-04 ENCOUNTER — Ambulatory Visit: Admitting: Dermatology
